# Patient Record
Sex: Male | Born: 1950 | Race: White | Hispanic: No | Marital: Married | Smoking: Never smoker
Health system: Southern US, Community
[De-identification: ages and names within clinical notes are randomized; demographics above are authoritative.]

## PROBLEM LIST (undated history)

## (undated) DIAGNOSIS — R42 Dizziness and giddiness: Secondary | ICD-10-CM

## (undated) DIAGNOSIS — Z860100 Personal history of colon polyps, unspecified: Secondary | ICD-10-CM

## (undated) DIAGNOSIS — I700 Atherosclerosis of aorta: Secondary | ICD-10-CM

## (undated) DIAGNOSIS — I2510 Atherosclerotic heart disease of native coronary artery without angina pectoris: Secondary | ICD-10-CM

## (undated) DIAGNOSIS — E119 Type 2 diabetes mellitus without complications: Secondary | ICD-10-CM

## (undated) DIAGNOSIS — E7800 Pure hypercholesterolemia, unspecified: Secondary | ICD-10-CM

## (undated) DIAGNOSIS — I10 Essential (primary) hypertension: Secondary | ICD-10-CM

## (undated) DIAGNOSIS — K219 Gastro-esophageal reflux disease without esophagitis: Secondary | ICD-10-CM

## (undated) HISTORY — PX: ESOPHAGOGASTRODUODENOSCOPY: SHX1529

## (undated) HISTORY — DX: Essential (primary) hypertension: I10

## (undated) HISTORY — DX: Personal history of colon polyps, unspecified: Z86.0100

## (undated) HISTORY — PX: HAND SURGERY: SHX662

## (undated) HISTORY — DX: Pure hypercholesterolemia, unspecified: E78.00

## (undated) HISTORY — DX: Atherosclerotic heart disease of native coronary artery without angina pectoris: I25.10

## (undated) HISTORY — DX: Dizziness and giddiness: R42

## (undated) HISTORY — DX: Atherosclerosis of aorta: I70.0

## (undated) HISTORY — DX: Gastro-esophageal reflux disease without esophagitis: K21.9

## (undated) HISTORY — DX: Personal history of colonic polyps: Z86.010

## (undated) HISTORY — PX: APPENDECTOMY: SHX54

## (undated) HISTORY — PX: COLONOSCOPY: SHX174

---

## 2011-08-28 DIAGNOSIS — K219 Gastro-esophageal reflux disease without esophagitis: Secondary | ICD-10-CM | POA: Diagnosis present

## 2015-10-23 DIAGNOSIS — M109 Gout, unspecified: Secondary | ICD-10-CM | POA: Diagnosis present

## 2016-04-26 DIAGNOSIS — K760 Fatty (change of) liver, not elsewhere classified: Secondary | ICD-10-CM | POA: Insufficient documentation

## 2016-09-14 DIAGNOSIS — B349 Viral infection, unspecified: Secondary | ICD-10-CM | POA: Insufficient documentation

## 2016-09-14 HISTORY — DX: Viral infection, unspecified: B34.9

## 2017-10-20 DIAGNOSIS — Z91119 Patient's noncompliance with dietary regimen due to unspecified reason: Secondary | ICD-10-CM

## 2017-10-20 HISTORY — DX: Patient's noncompliance with dietary regimen due to unspecified reason: Z91.119

## 2018-05-12 DIAGNOSIS — M792 Neuralgia and neuritis, unspecified: Secondary | ICD-10-CM | POA: Diagnosis present

## 2021-08-06 ENCOUNTER — Ambulatory Visit: Payer: Self-pay | Admitting: Podiatry

## 2021-08-14 ENCOUNTER — Other Ambulatory Visit: Payer: Self-pay

## 2021-08-14 ENCOUNTER — Ambulatory Visit (INDEPENDENT_AMBULATORY_CARE_PROVIDER_SITE_OTHER): Payer: Medicare Other | Admitting: Podiatry

## 2021-08-14 ENCOUNTER — Ambulatory Visit (INDEPENDENT_AMBULATORY_CARE_PROVIDER_SITE_OTHER): Payer: Medicare Other

## 2021-08-14 ENCOUNTER — Encounter: Payer: Self-pay | Admitting: Podiatry

## 2021-08-14 ENCOUNTER — Other Ambulatory Visit: Payer: Self-pay | Admitting: Podiatry

## 2021-08-14 DIAGNOSIS — R208 Other disturbances of skin sensation: Secondary | ICD-10-CM | POA: Diagnosis not present

## 2021-08-14 DIAGNOSIS — M21372 Foot drop, left foot: Secondary | ICD-10-CM | POA: Diagnosis not present

## 2021-08-14 DIAGNOSIS — M7752 Other enthesopathy of left foot: Secondary | ICD-10-CM

## 2021-08-14 NOTE — Progress Notes (Signed)
°  Subjective:  Patient ID: Ivan Dickson, male    DOB: July 14, 1951,  MRN: [id number] HPI Chief Complaint  Patient presents with   Foot Pain    Left Foot - no pain, noticed for last 4-6 weeks when walking foot slaps the ground, no changes in health or injury   New Patient (Initial Visit)   Diabetes    Last a1c was 8.0    70 y.o. male presents with the above complaint.   ROS: Denies fever chills nausea vomiting muscle aches pains calf pain back pain chest pain shortness of breath.  No past medical history on file.   Current Outpatient Medications:    glipiZIDE (GLUCOTROL) 10 MG tablet, Take 10 mg by mouth daily before breakfast., Disp: , Rfl:    pioglitazone (ACTOS) 30 MG tablet, Take 30 mg by mouth daily., Disp: , Rfl:    rosuvastatin (CRESTOR) 20 MG tablet, Take 20 mg by mouth daily., Disp: , Rfl:    lisinopril (ZESTRIL) 2.5 MG tablet, Take 2.5 mg by mouth daily., Disp: , Rfl:    metFORMIN (GLUCOPHAGE-XR) 500 MG 24 hr tablet, Take 1,000 mg by mouth 2 (two) times daily., Disp: , Rfl:    pantoprazole (PROTONIX) 40 MG tablet, Take 40 mg by mouth 2 (two) times daily., Disp: , Rfl:   Allergies  Allergen Reactions   Ciprofloxacin    Review of Systems Objective:  There were no vitals filed for this visit.  General: Well developed, nourished, in no acute distress, alert and oriented x3   Dermatological: Skin is warm, dry and supple bilateral. Nails x 10 are well maintained; remaining integument appears unremarkable at this time. There are no open sores, no preulcerative lesions, no rash or signs of infection present.  Vascular: Dorsalis Pedis artery and Posterior Tibial artery pedal pulses are 2/4 bilateral with immedate capillary fill time. Pedal hair growth present. No varicosities and no lower extremity edema present bilateral.   Neruologic: Grossly intact via light touch bilateral. Vibratory intact via tuning fork bilateral. Protective threshold with Semmes Wienstein monofilament  intact to all pedal sites bilateral. Patellar and Achilles deep tendon reflexes 2+ bilateral. No Babinski or clonus noted bilateral.   Musculoskeletal: No gross boney pedal deformities bilateral. No pain, crepitus, or limitation noted with foot and ankle range of motion bilateral. Muscular strength 5/5 in all groups tested bilateral.  Muscle strength is a 4 out of 5 tibialis anterior left.  Also has a decrease sensation in that same dermatome.  Gait: Unassisted, Nonantalgic.    Radiographs:  Radiographs taken today demonstrate osseously mature individual ankle and foot demonstrate relatively normal architecture.  Some spurring around the medial malleolus that the mortise appears to be normal.  No other significant overlying or acute issues identified.  Assessment & Plan:   Assessment: Diabetes mellitus with no diabetic complications.  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  Plan: Discussed etiology pathology conservative versus surgical therapies.  At this point I think we should send him during this acute period for nerve conduction velocity exam and an EMG.  Once this test return we will direct him to the appropriate referral or treatment plan.     Anju Sereno T. [place], [place]

## 2021-09-17 DIAGNOSIS — I10 Essential (primary) hypertension: Secondary | ICD-10-CM | POA: Diagnosis not present

## 2021-09-17 DIAGNOSIS — E782 Mixed hyperlipidemia: Secondary | ICD-10-CM | POA: Diagnosis not present

## 2021-09-17 DIAGNOSIS — Z8249 Family history of ischemic heart disease and other diseases of the circulatory system: Secondary | ICD-10-CM | POA: Diagnosis not present

## 2021-09-17 DIAGNOSIS — R1310 Dysphagia, unspecified: Secondary | ICD-10-CM | POA: Diagnosis not present

## 2021-09-17 DIAGNOSIS — Z823 Family history of stroke: Secondary | ICD-10-CM | POA: Diagnosis not present

## 2021-09-17 DIAGNOSIS — E1169 Type 2 diabetes mellitus with other specified complication: Secondary | ICD-10-CM | POA: Diagnosis not present

## 2021-09-17 DIAGNOSIS — E1165 Type 2 diabetes mellitus with hyperglycemia: Secondary | ICD-10-CM | POA: Diagnosis not present

## 2021-09-17 DIAGNOSIS — K219 Gastro-esophageal reflux disease without esophagitis: Secondary | ICD-10-CM | POA: Diagnosis not present

## 2021-10-15 ENCOUNTER — Emergency Department (HOSPITAL_COMMUNITY)
Admission: EM | Admit: 2021-10-15 | Discharge: 2021-10-15 | Disposition: A | Discharge status: Home / Self Care | Payer: Medicare HMO | Attending: Emergency Medicine | Admitting: Emergency Medicine

## 2021-10-15 ENCOUNTER — Encounter (HOSPITAL_COMMUNITY): Payer: Self-pay

## 2021-10-15 ENCOUNTER — Emergency Department (HOSPITAL_COMMUNITY): Payer: Medicare HMO

## 2021-10-15 ENCOUNTER — Other Ambulatory Visit: Payer: Self-pay

## 2021-10-15 DIAGNOSIS — M5410 Radiculopathy, site unspecified: Secondary | ICD-10-CM | POA: Diagnosis not present

## 2021-10-15 DIAGNOSIS — M5416 Radiculopathy, lumbar region: Secondary | ICD-10-CM | POA: Diagnosis not present

## 2021-10-15 DIAGNOSIS — M25552 Pain in left hip: Secondary | ICD-10-CM | POA: Diagnosis present

## 2021-10-15 HISTORY — DX: Type 2 diabetes mellitus without complications: E11.9

## 2021-10-15 MED ORDER — HYDROCODONE-ACETAMINOPHEN 5-325 MG PO TABS: 1.0000 | ORAL_TABLET | Freq: Four times a day (QID) | ORAL | 0 refills | Status: DC | PRN

## 2021-10-15 NOTE — Discharge Instructions (Signed)

## 2021-10-15 NOTE — ED Notes (Signed)
Pt stated three weeks ago his left foot was flopping while walking. PCP stated it may be a nerve concern. PCP was suppose to have someone follow-up to do a nerve conductor test. Pt stated it has resolved at the moment.

## 2021-10-15 NOTE — ED Triage Notes (Signed)
Patient complains of left hip pain x 6 days, reports severe at night when he tries to rest. Has used otc meds with no relief. Denies trauma

## 2021-10-15 NOTE — ED Provider Triage Note (Signed)
Emergency Medicine Provider Triage Evaluation Note  Ivan Dickson , a 71 y.o. male  was evaluated in triage.  Pt complains of left hip pain of 6-day duration with shooting pain down left lower extremity.  Denies night sweats, fever, chills.  States he is okay ambulating, or setting however if he stands or lays down the pain is worse.  Of note patient does keep wallet in his back pocket.  Review of Systems  Positive: As above Negative: As above  Physical Exam  BP 130/74    Pulse 93    Temp 98.3 F (36.8 C) (Oral)    Resp 16    SpO2 99%  Gen:   Awake, no distress   Resp:  Normal effort  MSK:   Moves extremities without difficulty  Other:  5/5 strength in left lower extremity.  Full range of motion in left lower extremity.  Medical Decision Making  Medically screening exam initiated at 2:01 PM.  Appropriate orders placed.  Ivan Dickson was informed that the remainder of the evaluation will be completed by another provider, this initial triage assessment does not replace that evaluation, and the importance of remaining in the ED until their evaluation is complete.     Evlyn Courier, PA-C 10/15/21 1402

## 2021-10-15 NOTE — ED Provider Notes (Signed)
[facility] EMERGENCY DEPARTMENT Provider Note   CSN: [id number] Arrival date & time: 10/15/21  1311     History  Chief Complaint  Patient presents with   Hip Pain    Ivan Dickson is a 71 y.o. male.  The history is provided by the patient and the spouse.  Hip Pain This is a new problem. The current episode started more than 2 days ago. The problem occurs daily. The problem has been gradually worsening. Pertinent negatives include no chest pain and no shortness of breath. The symptoms are aggravated by standing. Relieved by: Walking. Treatments tried: Ibuprofen/Aleve. The treatment provided no relief.  Patient presents with left hip and buttock pain.  Is been ongoing for about 6 days.  No trauma he does report recent heavy lifting.  He reports the pain starts in his left buttock and radiates down his posterior thigh.  No weakness or numbness.  He is able to ambulate.  No fecal or urinary incontinence.  No actual back pain.  No previous back or hip surgery.  It is worse with standing and improved with walking    Home Medications Prior to Admission medications   Medication Sig Start Date End Date Taking? Authorizing Provider  HYDROcodone-acetaminophen (NORCO/VICODIN) 5-325 MG tablet Take 1 tablet by mouth every 6 (six) hours as needed for severe pain. 10/15/21  Yes Ripley Fraise, MD  glipiZIDE (GLUCOTROL) 10 MG tablet Take 10 mg by mouth daily before breakfast.    [provider]  lisinopril (ZESTRIL) 2.5 MG tablet Take 2.5 mg by mouth daily. 07/20/21   [provider]  metFORMIN (GLUCOPHAGE-XR) 500 MG 24 hr tablet Take 1,000 mg by mouth 2 (two) times daily. 07/20/21   [provider]  pantoprazole (PROTONIX) 40 MG tablet Take 40 mg by mouth 2 (two) times daily. 07/20/21   [provider]  pioglitazone (ACTOS) 30 MG tablet Take 30 mg by mouth daily.    [provider]  rosuvastatin (CRESTOR) 20 MG tablet Take 20 mg by  mouth daily.    [provider]      Allergies    Ciprofloxacin    Review of Systems   Review of Systems  Constitutional:  Negative for fever.  Respiratory:  Negative for shortness of breath.   Cardiovascular:  Negative for chest pain.  Gastrointestinal:  Negative for blood in stool.  Musculoskeletal:  Positive for arthralgias. Negative for back pain.  Neurological:  Negative for weakness and numbness.  All other systems reviewed and are negative.  Physical Exam Updated Vital Signs BP (!) 162/84 (BP Location: Right Arm)    Pulse 78    Temp 97.7 F (36.5 C) (Oral)    Resp 20    Ht 1.829 m (6')    Wt 95.3 kg    SpO2 99%    BMI 28.48 kg/m  Physical Exam CONSTITUTIONAL: Well developed/well nourished HEAD: Normocephalic/atraumatic EYES: EOMI/PERRL ENMT: Mucous membranes moist NECK: supple no meningeal signs SPINE/BACK:entire spine nontender no bruising/crepitance/stepoffs noted to spine CV: S1/S2 noted, no murmurs/rubs/gallops noted LUNGS: Lungs are clear to auscultation bilaterally, no apparent distress ABDOMEN: soft, nontender, no rebound or guarding GU:no cva tenderness NEURO: Awake/alert, equal motor 5/5 strength noted with the following: hip flexion/knee flexion/extension, foot dorsi/plantar flexion, no clonus bilaterally, no sensory deficit in any dermatome.  Equal patellar/achilles reflex noted (2+) in bilateral lower extremities.  Pt is able to ambulate unassisted. EXTREMITIES: pulses normal, full ROM, no tenderness to palpation of the left buttock.  Distal pulses equal and intact SKIN: warm, color normal PSYCH: no abnormalities of mood noted, alert and oriented to situation  ED Results / Procedures / Treatments   Labs (all labs ordered are listed, but only abnormal results are displayed) Labs Reviewed - No data to display  EKG None  Radiology DG Hip Unilat W or Wo Pelvis 2-3 Views Left  Result Date: 10/15/2021 CLINICAL DATA:  Left hip pain. EXAM: DG HIP  (WITH OR WITHOUT PELVIS) 2-3V LEFT COMPARISON:  None. FINDINGS: There is no evidence of hip fracture or dislocation. There is no evidence of arthropathy or other focal bone abnormality. IMPRESSION: Negative. Electronically Signed   By: Misty Stanley M.D.   On: 10/15/2021 14:31    Procedures Procedures    Medications Ordered in ED Medications - No data to display  ED Course/ Medical Decision Making/ A&P                           Medical Decision Making Risk Prescription drug management.   Patient presents with left hip pain.  Most of his pain is the posterior buttocks and radiates down his leg.  Suspect this may be more of a radiculopathy although he does not have any back pain.  It was preceded by heavy lifting.  No trauma.  He has no focal neurodeficits.  I personally reviewed the x-ray that is negative. Patient has been using ibuprofen without relief.  Advised to use this for the next 5 days, will supplement with Vicodin as needed.  He already has PCP follow-up.  He may eventually need to have MRI.  He is not a candidate for steroids due to his diabetes.   Patient reports his recent history of left foot drop is improved without any focal weakness       Final Clinical Impression(s) / ED Diagnoses Final diagnoses:  Radiculopathy, unspecified spinal region    Rx / DC Orders ED Discharge Orders          Ordered    HYDROcodone-acetaminophen (NORCO/VICODIN) 5-325 MG tablet  Every 6 hours PRN        10/15/21 1723              Ripley Fraise, MD 10/15/21 1728

## 2021-10-15 NOTE — ED Notes (Signed)
Pt stated when he stands or lay stationary he has a sharp shooting pain in his hip. When moving there is no pain. No injuries noted.

## 2021-10-18 DIAGNOSIS — M25552 Pain in left hip: Secondary | ICD-10-CM | POA: Diagnosis not present

## 2021-10-18 DIAGNOSIS — K219 Gastro-esophageal reflux disease without esophagitis: Secondary | ICD-10-CM | POA: Diagnosis not present

## 2021-10-18 DIAGNOSIS — E1165 Type 2 diabetes mellitus with hyperglycemia: Secondary | ICD-10-CM | POA: Diagnosis not present

## 2021-10-18 DIAGNOSIS — R1310 Dysphagia, unspecified: Secondary | ICD-10-CM | POA: Diagnosis not present

## 2021-10-29 DIAGNOSIS — M5442 Lumbago with sciatica, left side: Secondary | ICD-10-CM | POA: Diagnosis not present

## 2021-10-31 ENCOUNTER — Encounter: Payer: Self-pay | Admitting: Gastroenterology

## 2021-11-06 DIAGNOSIS — M545 Low back pain, unspecified: Secondary | ICD-10-CM | POA: Diagnosis not present

## 2021-11-08 DIAGNOSIS — M5106 Intervertebral disc disorders with myelopathy, lumbar region: Secondary | ICD-10-CM | POA: Diagnosis not present

## 2021-11-13 ENCOUNTER — Encounter: Payer: Self-pay | Admitting: Gastroenterology

## 2021-11-13 ENCOUNTER — Telehealth: Payer: Self-pay

## 2021-11-13 ENCOUNTER — Other Ambulatory Visit: Payer: Self-pay

## 2021-11-13 ENCOUNTER — Ambulatory Visit (INDEPENDENT_AMBULATORY_CARE_PROVIDER_SITE_OTHER): Payer: Medicare HMO | Admitting: Gastroenterology

## 2021-11-13 ENCOUNTER — Other Ambulatory Visit (INDEPENDENT_AMBULATORY_CARE_PROVIDER_SITE_OTHER): Payer: Medicare HMO

## 2021-11-13 VITALS — BP 118/68 | HR 86 | Ht 72.0 in | Wt 202.2 lb

## 2021-11-13 DIAGNOSIS — R630 Anorexia: Secondary | ICD-10-CM

## 2021-11-13 DIAGNOSIS — Z1211 Encounter for screening for malignant neoplasm of colon: Secondary | ICD-10-CM

## 2021-11-13 DIAGNOSIS — R1310 Dysphagia, unspecified: Secondary | ICD-10-CM | POA: Diagnosis not present

## 2021-11-13 DIAGNOSIS — K219 Gastro-esophageal reflux disease without esophagitis: Secondary | ICD-10-CM | POA: Diagnosis not present

## 2021-11-13 NOTE — Progress Notes (Signed)
? ? ?          ?Chief Complaint: Dysphagia, GERD ? ? ?Referring Provider:     Harlen Labs, NP ? ? ?HPI:   ? ? ?Ivan Dickson is a 71 y.o. male with a history of HLD, HTN, diabetes, referred to the Gastroenterology Clinic for evaluation of dysphagia and GERD. ? ?He has a longstanding history for 15+ years of reflux with index symptoms of chronic cough, globus sensation, heartburn, regurgitation, hoarseness. Worse with supine, spicy foods .  HB, regurgitation largely controlled with pantoprazole 40 mg/qhs, but globus sensation, throat clearing, dysphagia persist. Dysphagia has been present for years, pointing to anterior neck/suprasternal notch. No food impactions. Intermittent choking with liquids as well. Occasional Tums prn breakthrough heartburn, but that is rare. Nexium was not effective.  Recently increased until resulted 40 mg bid. ? ?Additionally, he reports a loss of appetite over the last couple of months.  Weight stable. No f/c/n/v. No early satiety per se.  No nausea/vomiting. ? ?Had an EGD years ago in [place], and has since moved to [facility].  Prior EGD report not available for review. He does not recall results.  ? ?Had a negative Cologuard approximately 4 years ago. Has had 2 previous colonoscopies with polyps on the 1st (pathology and other details unknown), then none on the 2nd. Since then has had several negative Cologuard tests.  ? ?No known family history of CRC, GI malignancy, liver disease, pancreatic disease, or IBD.  Sister with RCC.  ? ? ?Past Medical History:  ?Diagnosis Date  ? Diabetes mellitus without complication (HCC)   ? GERD (gastroesophageal reflux disease)   ? High cholesterol   ? History of colon polyps   ? ? ? ?Past Surgical History:  ?Procedure Laterality Date  ? APPENDECTOMY    ? COLONOSCOPY    ? found polyps over 10 years ago  ? ESOPHAGOGASTRODUODENOSCOPY    ? over 10 years ago thinks he did  ? HAND SURGERY Right   ? ?Family History  ?Problem Relation Age of Onset  ? Kidney  cancer Sister   ?     renal cell cancer  ? Breast cancer Sister   ? Colon cancer Neg Hx   ? Esophageal cancer Neg Hx   ? ?Social History  ? ?Tobacco Use  ? Smoking status: Never  ? Smokeless tobacco: Never  ?Vaping Use  ? Vaping Use: Never used  ?Substance Use Topics  ? Alcohol use: Yes  ?  Comment: weekly  ? Drug use: Not Currently  ? ?Current Outpatient Medications  ?Medication Sig Dispense Refill  ? glipiZIDE (GLUCOTROL) 10 MG tablet Take 10 mg by mouth 2 (two) times daily before a meal.    ? lisinopril (ZESTRIL) 10 MG tablet Take 10 mg by mouth daily.    ? metFORMIN (GLUCOPHAGE-XR) 500 MG 24 hr tablet Take 1,000 mg by mouth 2 (two) times daily.    ? OZEMPIC, 0.25 OR 0.5 MG/DOSE, 2 MG/1.5ML SOPN Inject 0.5 mg into the skin once a week.    ? pantoprazole (PROTONIX) 40 MG tablet Take 40 mg by mouth 2 (two) times daily.    ? pioglitazone (ACTOS) 30 MG tablet Take 30 mg by mouth daily.    ? rosuvastatin (CRESTOR) 20 MG tablet Take 20 mg by mouth daily.    ? ?No current facility-administered medications for this visit.  ? ?Allergies  ?Allergen Reactions  ? Ciprofloxacin   ? ? ? ?Review of Systems: ?All systems reviewed and negative  except where noted in HPI.  ? ? ? ?Physical Exam:   ? ?Wt Readings from Last 3 Encounters:  ?11/13/21 202 lb 4 oz (91.7 kg)  ?10/15/21 210 lb (95.3 kg)  ? ? ?BP 118/68   Pulse 86   Ht 6' (1.829 m)   Wt 202 lb 4 oz (91.7 kg)   BMI 27.43 kg/m?  ?Constitutional:  Pleasant, in no acute distress. ?Psychiatric: Normal mood and affect. Behavior is normal. ?EENT: Pupils normal.  Conjunctivae are normal. No scleral icterus. ?Neck supple. No cervical LAD. ?Cardiovascular: Normal rate, regular rhythm. No edema ?Pulmonary/chest: Effort normal and breath sounds normal. No wheezing, rales or rhonchi. ?Abdominal: Soft, nondistended, nontender. Bowel sounds active throughout. There are no masses palpable. No hepatomegaly. ?Neurological: Alert and oriented to person place and time. ?Skin: Skin is warm  and dry. No rashes noted. ? ? ?ASSESSMENT AND PLAN;  ? ?1) GERD ?2) Dysphagia ?- EGD to evaluate for erosive esophagitis, LES laxity along with esophageal stricture, ring, etc. with biopsies and dilation as appropriate ?- Continue current therapy (recently increased pantoprazole to 40 mg bid; evaluating efficacy of higher dose now) ?- Continue antireflux lifestyle/dietary modifications ? ?3) Decreased appetite ?- Evaluate for mucosal/luminal pathology time EGD as above along with gastric biopsies ?- Check CBC, CMP, TSH ?- If no clear etiology and ongoing symptoms, plan for extended work-up for non-GI causes of decreased appetite ? ?4) Colon cancer screening/Reported history of colon polyps ?- Due for ongoing CRC screening ?- Schedule colonoscopy ? ?The indications, risks, and benefits of EGD and colonoscopy were explained to the patient in detail. Risks include but are not limited to bleeding, perforation, adverse reaction to medications, and cardiopulmonary compromise. Sequelae include but are not limited to the possibility of surgery, hospitalization, and mortality. The patient verbalized understanding and wished to proceed. All questions answered, referred to scheduler and bowel prep ordered. Further recommendations pending results of the exam.  ? ? ? ? ?Shellia Cleverly, DO, FACG  11/13/2021, 1:21 PM ? ? ?Harlen Labs, NP ? ?

## 2021-11-13 NOTE — Telephone Encounter (Signed)
Patient just left the clinic. Called the patient to inform not to take his Ozempic in the morning on the day of his procedure which is on Thursday, 11/22/21. Patient verbalized understanding that he takes ozempic every Thursday in the morning but he won't take it on that day of his procedure. ?

## 2021-11-13 NOTE — Patient Instructions (Addendum)
If you are age 70 or older, your body mass index should be between 23-30. Your Body mass index is 27.43 kg/m?Marland Kitchen If this is out of the aforementioned range listed, please consider follow up with your Primary Care Provider. ? ?__________________________________________________________ ? ?The [facility] providers would like to encourage you to use [facility] to communicate with providers for non-urgent requests or questions.  Due to long hold times on the telephone, sending your provider a message by [facility] may be a faster and more efficient way to get a response.  Please allow 48 business hours for a response.  Please remember that this is for non-urgent requests.  ? ?Due to recent changes in healthcare laws, you may see the results of your imaging and laboratory studies on MyChart before your provider has had a chance to review them.  We understand that in some cases there may be results that are confusing or concerning to you. Not all laboratory results come back in the same time frame and the provider may be waiting for multiple results in order to interpret others.  Please give [place] 48 hours in order for your provider to thoroughly review all the results before contacting the office for clarification of your results.  ? ?Please go to the lab on the 2nd floor [address] before you leave the office today.   ? ?We have given you samples of the following medication to take: Clenpiq ? ?You have been scheduled for an endoscopy and colonoscopy. Please follow the written instructions given to you at your visit today. ?Please pick up your prep supplies at the pharmacy within the next 1-3 days. ?If you use inhalers (even only as needed), please bring them with you on the day of your procedure.  ? ?Thank you for choosing me and [facility]. ? ?Doristine Locks, D.O. ? ? ?

## 2021-11-14 LAB — CBC
HCT: 40.7 % (ref 39.0–52.0)
Hemoglobin: 13.8 g/dL (ref 13.0–17.0)
MCHC: 33.9 g/dL (ref 30.0–36.0)
MCV: 88 fl (ref 78.0–100.0)
Platelets: 214 10*3/uL (ref 150.0–400.0)
RBC: 4.62 Mil/uL (ref 4.22–5.81)
RDW: 13.4 % (ref 11.5–15.5)
WBC: 6.3 10*3/uL (ref 4.0–10.5)

## 2021-11-14 LAB — COMPREHENSIVE METABOLIC PANEL
ALT: 19 U/L (ref 0–53)
AST: 14 U/L (ref 0–37)
Albumin: 4.5 g/dL (ref 3.5–5.2)
Alkaline Phosphatase: 66 U/L (ref 39–117)
BUN: 33 mg/dL — ABNORMAL HIGH (ref 6–23)
CO2: 26 mEq/L (ref 19–32)
Calcium: 9 mg/dL (ref 8.4–10.5)
Chloride: 102 mEq/L (ref 96–112)
Creatinine, Ser: 1.43 mg/dL (ref 0.40–1.50)
GFR: 49.92 mL/min — ABNORMAL LOW (ref >60.00)
Glucose, Bld: 190 mg/dL — ABNORMAL HIGH (ref 70–99)
Potassium: 4.9 mEq/L (ref 3.5–5.1)
Sodium: 137 mEq/L (ref 135–145)
Total Bilirubin: 0.5 mg/dL (ref 0.2–1.2)
Total Protein: 6.9 g/dL (ref 6.0–8.3)

## 2021-11-14 LAB — TSH: TSH: 3.04 u[IU]/mL (ref 0.35–5.50)

## 2021-11-15 DIAGNOSIS — M5106 Intervertebral disc disorders with myelopathy, lumbar region: Secondary | ICD-10-CM | POA: Diagnosis not present

## 2021-11-15 DIAGNOSIS — M5416 Radiculopathy, lumbar region: Secondary | ICD-10-CM | POA: Diagnosis not present

## 2021-11-22 ENCOUNTER — Ambulatory Visit (AMBULATORY_SURGERY_CENTER): Payer: Medicare HMO | Admitting: Gastroenterology

## 2021-11-22 ENCOUNTER — Encounter: Payer: Self-pay | Admitting: Gastroenterology

## 2021-11-22 VITALS — BP 108/75 | HR 68 | Temp 96.8°F | Resp 12 | Ht 72.0 in | Wt 202.0 lb

## 2021-11-22 DIAGNOSIS — R630 Anorexia: Secondary | ICD-10-CM | POA: Diagnosis not present

## 2021-11-22 DIAGNOSIS — R1310 Dysphagia, unspecified: Secondary | ICD-10-CM | POA: Diagnosis not present

## 2021-11-22 DIAGNOSIS — Z1211 Encounter for screening for malignant neoplasm of colon: Secondary | ICD-10-CM

## 2021-11-22 DIAGNOSIS — K219 Gastro-esophageal reflux disease without esophagitis: Secondary | ICD-10-CM

## 2021-11-22 DIAGNOSIS — K640 First degree hemorrhoids: Secondary | ICD-10-CM

## 2021-11-22 MED ORDER — SODIUM CHLORIDE 0.9 % IV SOLN: 500.0000 mL | Freq: Once | INTRAVENOUS | Status: DC

## 2021-11-22 NOTE — Patient Instructions (Signed)
YOU HAD AN ENDOSCOPIC PROCEDURE TODAY AT THE [facility]:   Refer to the procedure report that was given to you for any specific questions about what was found during the examination.  If the procedure report does not answer your questions, please call your gastroenterologist to clarify.  If you requested that your care partner not be given the details of your procedure findings, then the procedure report has been included in a sealed envelope for you to review at your convenience later. ° °**Handout given on hemorrhoids** ° °YOU SHOULD EXPECT: Some feelings of bloating in the abdomen. Passage of more gas than usual.  Walking can help get rid of the air that was put into your GI tract during the procedure and reduce the bloating. If you had a lower endoscopy (such as a colonoscopy or flexible sigmoidoscopy) you may notice spotting of blood in your stool or on the toilet paper. If you underwent a bowel prep for your procedure, you may not have a normal bowel movement for a few days. ° °Please Note:  You might notice some irritation and congestion in your nose or some drainage.  This is from the oxygen used during your procedure.  There is no need for concern and it should clear up in a day or so. ° °SYMPTOMS TO REPORT IMMEDIATELY: ° °Following lower endoscopy (colonoscopy or flexible sigmoidoscopy): ° Excessive amounts of blood in the stool ° Significant tenderness or worsening of abdominal pains ° Swelling of the abdomen that is new, acute ° Fever of 100°F or higher ° °Following upper endoscopy (EGD) ° Vomiting of blood or coffee ground material ° New chest pain or pain under the shoulder blades ° Painful or persistently difficult swallowing ° New shortness of breath ° Fever of 100°F or higher ° Black, tarry-looking stools ° °For urgent or emergent issues, a gastroenterologist can be reached at any hour by calling [phone]. °Do not use MyChart messaging for urgent concerns.  ° ° °DIET:  We do  recommend a small meal at first, but then you may proceed to your regular diet.  Drink plenty of fluids but you should avoid alcoholic beverages for 24 hours. ° °ACTIVITY:  You should plan to take it easy for the rest of today and you should NOT DRIVE or use heavy machinery until tomorrow (because of the sedation medicines used during the test).   ° °FOLLOW UP: °Our staff will call the number listed on your records 48-72 hours following your procedure to check on you and address any questions or concerns that you may have regarding the information given to you following your procedure. If we do not reach you, we will leave a message.  We will attempt to reach you two times.  During this call, we will ask if you have developed any symptoms of COVID 19. If you develop any symptoms (ie: fever, flu-like symptoms, shortness of breath, cough etc.) before then, please call [phone].  If you test positive for Covid 19 in the 2 weeks post procedure, please call and report this information to us.   ° °If any biopsies were taken you will be contacted by phone or by letter within the next 1-3 weeks.  Please call us at [phone] if you have not heard about the biopsies in 3 weeks.  ° ° °SIGNATURES/CONFIDENTIALITY: °You and/or your care partner have signed paperwork which will be entered into your electronic medical record.  These signatures attest to the fact that that the   information above on your After Visit Summary has been reviewed and is understood.  Full responsibility of the confidentiality of this discharge information lies with you and/or your care-partner.  °

## 2021-11-22 NOTE — Progress Notes (Signed)
? ?GASTROENTEROLOGY PROCEDURE H&P NOTE  ? ?Primary Care Physician: ?Pcp, No ? ? ? ?Reason for Procedure:   GERD, dysphagia, CRC screening  ? ?Plan:    EGD, colonoscopy ? ?Patient is appropriate for endoscopic procedure(s) in the ambulatory ([place]) setting. ? ?The nature of the procedure, as well as the risks, benefits, and alternatives were carefully and thoroughly reviewed with the patient. Ample time for discussion and questions allowed. The patient understood, was satisfied, and agreed to proceed.  ? ? ? ?HPI: ?Ivan Dickson is a 71 y.o. male who presents for EGD for evaluation of GERD and dysphagia and for colonsocopy for CRC screening.  ? ?Past Medical History:  ?Diagnosis Date  ? Diabetes mellitus without complication ([place])   ? GERD (gastroesophageal reflux disease)   ? High cholesterol   ? History of colon polyps   ? ? ?Past Surgical History:  ?Procedure Laterality Date  ? APPENDECTOMY    ? COLONOSCOPY    ? found polyps over 10 years ago  ? ESOPHAGOGASTRODUODENOSCOPY    ? over 10 years ago thinks he did  ? HAND SURGERY Right   ? ? ?Prior to Admission medications   ?Medication Sig Start Date End Date Taking? Authorizing Provider  ?Blood Glucose Monitoring Suppl (TRUE METRIX METER) w/Device KIT See admin instructions. 11/20/21  Yes [provider]  ?glipiZIDE (GLUCOTROL) 10 MG tablet Take 10 mg by mouth 2 (two) times daily before a meal.   Yes [provider]  ?metFORMIN (GLUCOPHAGE-XR) 500 MG 24 hr tablet Take 1,000 mg by mouth 2 (two) times daily. 07/20/21  Yes [provider]  ?pantoprazole (PROTONIX) 40 MG tablet Take 40 mg by mouth 2 (two) times daily. 07/20/21  Yes [provider]  ?pioglitazone (ACTOS) 30 MG tablet Take 30 mg by mouth daily.   Yes [provider]  ?rosuvastatin (CRESTOR) 20 MG tablet Take 20 mg by mouth daily.   Yes [provider]  ?TRUE METRIX BLOOD GLUCOSE TEST test strip daily. 11/20/21  Yes [provider]  ?lisinopril  (ZESTRIL) 10 MG tablet Take 10 mg by mouth daily. ?Patient not taking: Reported on 11/22/2021 09/17/21   [provider]  ?OZEMPIC, 0.25 OR 0.5 MG/DOSE, 2 MG/1.5ML SOPN Inject 0.5 mg into the skin once a week. 10/18/21   [provider]  ? ? ?Current Outpatient Medications  ?Medication Sig Dispense Refill  ? Blood Glucose Monitoring Suppl (TRUE METRIX METER) w/Device KIT See admin instructions.    ? glipiZIDE (GLUCOTROL) 10 MG tablet Take 10 mg by mouth 2 (two) times daily before a meal.    ? metFORMIN (GLUCOPHAGE-XR) 500 MG 24 hr tablet Take 1,000 mg by mouth 2 (two) times daily.    ? pantoprazole (PROTONIX) 40 MG tablet Take 40 mg by mouth 2 (two) times daily.    ? pioglitazone (ACTOS) 30 MG tablet Take 30 mg by mouth daily.    ? rosuvastatin (CRESTOR) 20 MG tablet Take 20 mg by mouth daily.    ? TRUE METRIX BLOOD GLUCOSE TEST test strip daily.    ? lisinopril (ZESTRIL) 10 MG tablet Take 10 mg by mouth daily. (Patient not taking: Reported on 11/22/2021)    ? OZEMPIC, 0.25 OR 0.5 MG/DOSE, 2 MG/1.5ML SOPN Inject 0.5 mg into the skin once a week.    ? ?Current Facility-Administered Medications  ?Medication Dose Route Frequency Provider Last Rate Last Admin  ? 0.9 %  sodium chloride infusion  500 mL Intravenous Once Bailyn Spackman V, DO      ? ? ?  Allergies as of 11/22/2021 - Review Complete 11/22/2021  ?Allergen Reaction Noted  ? Ciprofloxacin  08/14/2021  ? ? ?Family History  ?Problem Relation Age of Onset  ? Kidney cancer Sister   ?     renal cell cancer  ? Breast cancer Sister   ? Colon cancer Neg Hx   ? Esophageal cancer Neg Hx   ? ? ?Social History  ? ?Socioeconomic History  ? Marital status: Married  ?  Spouse name: Not on file  ? Number of children: Not on file  ? Years of education: Not on file  ? Highest education level: Not on file  ?Occupational History  ? Not on file  ?Tobacco Use  ? Smoking status: Never  ? Smokeless tobacco: Never  ?Vaping Use  ? Vaping Use: Never used  ?Substance and  Sexual Activity  ? Alcohol use: Yes  ?  Comment: weekly  ? Drug use: Not Currently  ? Sexual activity: Not on file  ?Other Topics Concern  ? Not on file  ?Social History Narrative  ? Not on file  ? ?Social Determinants of Health  ? ?Financial Resource Strain: Not on file  ?Food Insecurity: Not on file  ?Transportation Needs: Not on file  ?Physical Activity: Not on file  ?Stress: Not on file  ?Social Connections: Not on file  ?Intimate Partner Violence: Not on file  ? ? ?Physical Exam: ?Vital signs in last 24 hours: ?$RemoveBe'@BP'DvLvTiZJS$  122/65   Pulse 86   Temp (!) 96.8 ?F (36 ?C)   Ht 6' (1.829 m)   Wt 202 lb (91.6 kg)   SpO2 99%   BMI 27.40 kg/m?  ?GEN: NAD ?EYE: Sclerae anicteric ?ENT: MMM ?CV: Non-tachycardic ?Pulm: CTA b/l ?GI: Soft, NT/ND ?NEURO:  Alert & Oriented x 3 ? ? ?Gerrit Heck, DO ?[facility] ? ? ?11/22/2021 2:02 PM ? ?

## 2021-11-22 NOTE — Op Note (Signed)
[place] ?Patient Name: Ivan Dickson ?Procedure Date: 11/22/2021 1:52 PM ?MRN: [id number] ?Endoscopist: Gerrit Heck , MD ?Age: 71 ?Referring MD:  ?Date of Birth: 1951/05/28 ?Gender: Male ?Account #: [id number] ?Procedure:                Upper GI endoscopy ?Indications:              Dysphagia, Suspected esophageal reflux, Decreased  ?                          appetite ?Medicines:                Monitored Anesthesia Care ?Procedure:                Pre-Anesthesia Assessment: ?                          - Prior to the procedure, a History and Physical  ?                          was performed, and patient medications and  ?                          allergies were reviewed. The patient's tolerance of  ?                          previous anesthesia was also reviewed. The risks  ?                          and benefits of the procedure and the sedation  ?                          options and risks were discussed with the patient.  ?                          All questions were answered, and informed consent  ?                          was obtained. Prior Anticoagulants: The patient has  ?                          taken no previous anticoagulant or antiplatelet  ?                          agents. ASA Grade Assessment: II - A patient with  ?                          mild systemic disease. After reviewing the risks  ?                          and benefits, the patient was deemed in  ?                          satisfactory condition to undergo the procedure. ?  After obtaining informed consent, the endoscope was  ?                          passed under direct vision. Throughout the  ?                          procedure, the patient's blood pressure, pulse, and  ?                          oxygen saturations were monitored continuously. The  ?                          Endoscope was introduced through the mouth, and  ?                          advanced to the second part of duodenum. The  upper  ?                          GI endoscopy was accomplished without difficulty.  ?                          The patient tolerated the procedure well. ?Scope In: ?Scope Out: ?Findings:                 The examined esophagus was normal. A guidewire was  ?                          placed and the scope was withdrawn. Dilation was  ?                          performed with a Savary dilator with no resistance  ?                          at 17 mm. The dilation site was examined following  ?                          endoscope reinsertion and showed no bleeding,  ?                          mucosal tear or perforation. Estimated blood loss:  ?                          none. Biopsies were then obtained from the proximal  ?                          and distal esophagus with cold forceps for  ?                          histology of suspected eosinophilic esophagitis.  ?                          Estimated blood loss was minimal. ?  The Z-line was regular and was found 40 cm from the  ?                          incisors. ?                          The gastroesophageal flap valve was visualized  ?                          endoscopically and classified as Hill Grade II  ?                          (fold present, opens with respiration). ?                          The entire examined stomach was normal. ?                          The examined duodenum was normal. ?Complications:            No immediate complications. ?Estimated Blood Loss:     Estimated blood loss was minimal. ?Impression:               - Normal esophagus. Dilated with 17 mm Savary  ?                          dilator, then biopsied. ?                          - Z-line regular, 40 cm from the incisors. ?                          - Gastroesophageal flap valve classified as Hill  ?                          Grade II (fold present, opens with respiration). ?                          - Normal stomach. ?                          - Normal examined  duodenum. ?Recommendation:           - Patient has a contact number available for  ?                          emergencies. The signs and symptoms of potential  ?                          delayed complications were discussed with the  ?                          patient. Return to normal activities tomorrow.  ?                          Written discharge instructions were provided to the  ?  patient. ?                          - Resume previous diet. ?                          - Continue present medications. ?                          - Await pathology results. ?                          - Repeat upper endoscopy PRN. ?Gerrit Heck, MD ?11/22/2021 2:42:49 PM ?

## 2021-11-22 NOTE — Progress Notes (Signed)
Called to room to assist during endoscopic procedure.  Patient ID and intended procedure confirmed with present staff. Received instructions for my participation in the procedure from the performing physician.  

## 2021-11-22 NOTE — Op Note (Signed)
[place] ?Patient Name: Ivan Dickson ?Procedure Date: 11/22/2021 1:52 PM ?MRN: [id number] ?Endoscopist: Gerrit Heck , MD ?Age: 71 ?Referring MD:  ?Date of Birth: 1950-09-30 ?Gender: Male ?Account #: [id number] ?Procedure:                Colonoscopy ?Indications:              Screening for colorectal malignant neoplasm (last  ?                          colonoscopy was 10+ years ago) ?Medicines:                Monitored Anesthesia Care ?Procedure:                Pre-Anesthesia Assessment: ?                          - Prior to the procedure, a History and Physical  ?                          was performed, and patient medications and  ?                          allergies were reviewed. The patient's tolerance of  ?                          previous anesthesia was also reviewed. The risks  ?                          and benefits of the procedure and the sedation  ?                          options and risks were discussed with the patient.  ?                          All questions were answered, and informed consent  ?                          was obtained. Prior Anticoagulants: The patient has  ?                          taken no previous anticoagulant or antiplatelet  ?                          agents. ASA Grade Assessment: II - A patient with  ?                          mild systemic disease. After reviewing the risks  ?                          and benefits, the patient was deemed in  ?                          satisfactory condition to undergo the procedure. ?  After obtaining informed consent, the colonoscope  ?                          was passed under direct vision. Throughout the  ?                          procedure, the patient's blood pressure, pulse, and  ?                          oxygen saturations were monitored continuously. The  ?                          Olympus CF-HQ190L (Serial# [id number]) Colonoscope was  ?                          introduced through the anus and  advanced to the the  ?                          terminal ileum. The colonoscopy was performed  ?                          without difficulty. The patient tolerated the  ?                          procedure well. The quality of the bowel  ?                          preparation was good. The terminal ileum, ileocecal  ?                          valve, appendiceal orifice, and rectum were  ?                          photographed. ?Scope In: 2:21:45 PM ?Scope Out: 2:35:42 PM ?Scope Withdrawal Time: 0 hours 11 minutes 31 seconds  ?Total Procedure Duration: 0 hours 13 minutes 57 seconds  ?Findings:                 The perianal and digital rectal examinations were  ?                          normal. ?                          The colon appeared normal. ?                          Non-bleeding internal hemorrhoids were found during  ?                          retroflexion. The hemorrhoids were small and Grade  ?                          I (internal hemorrhoids that do not prolapse). ?  The terminal ileum appeared normal. ?Complications:            No immediate complications. ?Estimated Blood Loss:     Estimated blood loss: none. ?Impression:               - The entire examined colon is normal. ?                          - Non-bleeding internal hemorrhoids. ?                          - The examined portion of the ileum was normal. ?                          - No specimens collected. ?Recommendation:           - Patient has a contact number available for  ?                          emergencies. The signs and symptoms of potential  ?                          delayed complications were discussed with the  ?                          patient. Return to normal activities tomorrow.  ?                          Written discharge instructions were provided to the  ?                          patient. ?                          - Resume previous diet. ?                          - Continue present medications. ?                           - Repeat colonoscopy is not recommended due to  ?                          current age (7 years or older) for screening  ?                          purposes. ?                          - Return to GI office PRN. ?Gerrit Heck, MD ?11/22/2021 2:46:40 PM ?

## 2021-11-22 NOTE — Progress Notes (Signed)
VS  DT ? ?Pt's states no medical or surgical changes since previsit or office visit. ? ?

## 2021-11-22 NOTE — Progress Notes (Signed)
Pt non-responsive, VVS, Report to RN  °

## 2021-11-26 ENCOUNTER — Telehealth: Payer: Self-pay | Admitting: *Deleted

## 2021-11-26 NOTE — Telephone Encounter (Signed)
?  Follow up Call- ? ? ?  11/22/2021  ?  1:35 PM  ?Call back number  ?Post procedure Call Back phone  # [phone]  ?Permission to leave phone message Yes  ?  ? ?Patient questions: ? ?Do you have a fever, pain , or abdominal swelling? No. ?Pain Score  0 * ? ?Have you tolerated food without any problems? Yes.   ? ?Have you been able to return to your normal activities? Yes.   ? ?Do you have any questions about your discharge instructions: ?Diet   No. ?Medications  No. ?Follow up visit  No. ? ?Do you have questions or concerns about your Care? No. ? ?Actions: ?* If pain score is 4 or above: ?No action needed, pain <4. ? ? ?

## 2021-11-27 ENCOUNTER — Encounter: Payer: Self-pay | Admitting: Gastroenterology

## 2021-12-05 DIAGNOSIS — M5106 Intervertebral disc disorders with myelopathy, lumbar region: Secondary | ICD-10-CM | POA: Diagnosis not present

## 2021-12-21 NOTE — Progress Notes (Signed)
?  ?Cardiology Office Note ? ? ?Date:  12/26/2021  ? ?ID:  Ivan Dickson, DOB Aug 30, 1951, MRN [id number] ? ?PCP:  Pcp, No  ?Cardiologist:   Waylin Dorko Martinique, MD  ? ?Chief Complaint  ?Patient presents with  ? Dizziness  ? ? ?  ?History of Present Illness: ?Ivan Dickson is a 71 y.o. male who is seen at the request of Jaclynn Major for evaluation of HTN and family history of CAD. Has history of HLD and DM type 2.  ? ?He reports over past year that he has symptoms of lightheadedness and dizziness when out working in the yard. Worse after squatting or bending over. Noted BP as low as 60/40. Was on lisinopril for renal protection but this has been discontinued. No real change in symptoms. When this happens he stops and rests. No true syncope.  Does complain of exertional pain in neck and shoulders. Did see a cardiologist a couple of years ago in [place]. Apparently had stress test, Echo, and Zio patch monitor then. Is very concerned about CV risk given risk factors of DM, HLD, and strong family history of CAD.  ? ? ? ?Past Medical History:  ?Diagnosis Date  ? Diabetes mellitus without complication ([place])   ? GERD (gastroesophageal reflux disease)   ? High cholesterol   ? History of colon polyps   ? ? ?Past Surgical History:  ?Procedure Laterality Date  ? APPENDECTOMY    ? COLONOSCOPY    ? found polyps over 10 years ago  ? ESOPHAGOGASTRODUODENOSCOPY    ? over 10 years ago thinks he did  ? HAND SURGERY Right   ? ? ? ?Current Outpatient Medications  ?Medication Sig Dispense Refill  ? Blood Glucose Monitoring Suppl (TRUE METRIX METER) w/Device KIT See admin instructions.    ? glipiZIDE (GLUCOTROL) 10 MG tablet Take 10 mg by mouth 2 (two) times daily before a meal.    ? metFORMIN (GLUCOPHAGE-XR) 500 MG 24 hr tablet Take 1,000 mg by mouth 2 (two) times daily.    ? metoprolol tartrate (LOPRESSOR) 50 MG tablet Take 50 mg 2 hours before Coronary CT 1 tablet 0  ? OZEMPIC, 0.25 OR 0.5 MG/DOSE, 2 MG/1.5ML SOPN Inject 0.5 mg into the  skin once a week.    ? pantoprazole (PROTONIX) 40 MG tablet Take 40 mg by mouth 2 (two) times daily.    ? pioglitazone (ACTOS) 30 MG tablet Take 30 mg by mouth daily.    ? rosuvastatin (CRESTOR) 20 MG tablet Take 20 mg by mouth daily.    ? TRUE METRIX BLOOD GLUCOSE TEST test strip daily.    ? ?No current facility-administered medications for this visit.  ? ? ?Allergies:   Ciprofloxacin  ? ? ?Social History:  The patient  reports that he has never smoked. He has never used smokeless tobacco. He reports current alcohol use. He reports that he does not currently use drugs.  ? ?Family History:  The patient's family history includes Arrhythmia in his sister; Breast cancer in his sister; Diabetes in his mother; Heart attack in his brother; Heart disease in his brother and mother; Heart failure in his son; Hyperlipidemia in his mother; Hypertension in his mother; Kidney cancer in his sister; Stroke in his father.  ? ? ?ROS:  Please see the history of present illness.   Otherwise, review of systems are positive for none.   All other systems are reviewed and negative.  ? ? ?PHYSICAL EXAM: ?VS:  BP (!) 161/76   Pulse  61   Ht 6' (1.829 m)   Wt 204 lb (92.5 kg)   SpO2 98%   BMI 27.67 kg/m?  , BMI Body mass index is 27.67 kg/m?. ?On my exam  ?Supine BP 142/70 ?Sitting BP 140/70 ?Standing 1 minute 138/70 ?Standing 3 minutes 128/64 ? ?Gen: Well nourished, well developed, in no acute distress ?HEENT: normal ?Neck: no JVD, carotid bruits, or masses ?Cardiac: RRR; no murmurs, rubs, or gallops,no edema  ?Respiratory:  clear to auscultation bilaterally, normal work of breathing ?GI: soft, nontender, nondistended, + BS ?MS: no deformity or atrophy ?Skin: warm and dry, no rash ?Neuro:  Strength and sensation are intact ?Psych: euthymic mood, full affect ? ? ?EKG:  EKG is ordered today. ?The ekg ordered today demonstrates NSR rate 61. Normal. I have personally reviewed and interpreted this study. ? ? ? ?Recent Labs: ?11/13/2021: ALT  19; BUN 33; Creatinine, Ser 1.43; Hemoglobin 13.8; Platelets 214.0; Potassium 4.9; Sodium 137; TSH 3.04  ? ? ?Lipid Panel ?No results found for: CHOL, TRIG, HDL, CHOLHDL, VLDL, LDLCALC, LDLDIRECT ?  ? ?Wt Readings from Last 3 Encounters:  ?12/26/21 204 lb (92.5 kg)  ?11/22/21 202 lb (91.6 kg)  ?11/13/21 202 lb 4 oz (91.7 kg)  ?  ? ? ?Other studies Reviewed: ?Additional studies/ records that were reviewed today include: none. ?Review of the above records demonstrates: N/A ? ? ?ASSESSMENT AND PLAN: ? ?1.  Lightheadedness and dizziness. Somewhat positional. Has been associated with low BP. Not that orthostatic today. Suspect he has some degree of autonomic dysfunction related to his DM. Agree with stopping lisinopril. Liberalize sodium in diet. Focus on maintaining good hydration, support hose. Sleep with some incline. ?2. Exertional neck and should pain. Will proceed with coronary CTA to establish CV risk given multiple risk factors ?3. HLD. Will update lipid panel.  ?4. Renal insufficieny. Creatinine 1.41. will repeat now that off ACEi ? ?Will also request prior records from [facility] including results of stress test, Echo, monitor and prior labs.  ? ? ?Current medicines are reviewed at length with the patient today.  The patient does not have concerns regarding medicines. ? ?The following changes have been made:  no change ? ?Labs/ tests ordered today include:  ? ?Orders Placed This Encounter  ?Procedures  ? CT CORONARY MORPH W/CTA COR W/SCORE W/CA W/CM &/OR WO/CM  ? Basic metabolic panel  ? Lipid panel  ? EKG 12-Lead  ? ? ?   ? ? ?Disposition:   FU with me in after above studies. ? ?Signed, ?Zachary Lovins Martinique, MD  ?12/26/2021 10:41 AM    ?[facility] ?[address] [place], [place], [address] ?Phone [phone], Fax [phone] ? ? ?

## 2021-12-21 NOTE — H&P (View-Only) (Signed)
?  ?Cardiology Office Note ? ? ?Date:  12/26/2021  ? ?ID:  Ivan Dickson, DOB 05/03/1951, MRN [id number] ? ?PCP:  Pcp, No  ?Cardiologist:   Nicki Gracy, MD  ? ?Chief Complaint  ?Patient presents with  ? Dizziness  ? ? ?  ?History of Present Illness: ?Ivan Dickson is a 70 y.o. male who is seen at the request of Jacqueline Mawoneke for evaluation of HTN and family history of CAD. Has history of HLD and DM type 2.  ? ?He reports over past year that he has symptoms of lightheadedness and dizziness when out working in the yard. Worse after squatting or bending over. Noted BP as low as 60/40. Was on lisinopril for renal protection but this has been discontinued. No real change in symptoms. When this happens he stops and rests. No true syncope.  Does complain of exertional pain in neck and shoulders. Did see a cardiologist a couple of years ago in [place]. Apparently had stress test, Echo, and Zio patch monitor then. Is very concerned about CV risk given risk factors of DM, HLD, and strong family history of CAD.  ? ? ? ?Past Medical History:  ?Diagnosis Date  ? Diabetes mellitus without complication (HCC)   ? GERD (gastroesophageal reflux disease)   ? High cholesterol   ? History of colon polyps   ? ? ?Past Surgical History:  ?Procedure Laterality Date  ? APPENDECTOMY    ? COLONOSCOPY    ? found polyps over 10 years ago  ? ESOPHAGOGASTRODUODENOSCOPY    ? over 10 years ago thinks he did  ? HAND SURGERY Right   ? ? ? ?Current Outpatient Medications  ?Medication Sig Dispense Refill  ? Blood Glucose Monitoring Suppl (TRUE METRIX METER) w/Device KIT See admin instructions.    ? glipiZIDE (GLUCOTROL) 10 MG tablet Take 10 mg by mouth 2 (two) times daily before a meal.    ? metFORMIN (GLUCOPHAGE-XR) 500 MG 24 hr tablet Take 1,000 mg by mouth 2 (two) times daily.    ? metoprolol tartrate (LOPRESSOR) 50 MG tablet Take 50 mg 2 hours before Coronary CT 1 tablet 0  ? OZEMPIC, 0.25 OR 0.5 MG/DOSE, 2 MG/1.5ML SOPN Inject 0.5 mg into the  skin once a week.    ? pantoprazole (PROTONIX) 40 MG tablet Take 40 mg by mouth 2 (two) times daily.    ? pioglitazone (ACTOS) 30 MG tablet Take 30 mg by mouth daily.    ? rosuvastatin (CRESTOR) 20 MG tablet Take 20 mg by mouth daily.    ? TRUE METRIX BLOOD GLUCOSE TEST test strip daily.    ? ?No current facility-administered medications for this visit.  ? ? ?Allergies:   Ciprofloxacin  ? ? ?Social History:  The patient  reports that he has never smoked. He has never used smokeless tobacco. He reports current alcohol use. He reports that he does not currently use drugs.  ? ?Family History:  The patient's family history includes Arrhythmia in his sister; Breast cancer in his sister; Diabetes in his mother; Heart attack in his brother; Heart disease in his brother and mother; Heart failure in his son; Hyperlipidemia in his mother; Hypertension in his mother; Kidney cancer in his sister; Stroke in his father.  ? ? ?ROS:  Please see the history of present illness.   Otherwise, review of systems are positive for none.   All other systems are reviewed and negative.  ? ? ?PHYSICAL EXAM: ?VS:  BP (!) 161/76   Pulse   61   Ht 6' (1.829 m)   Wt 204 lb (92.5 kg)   SpO2 98%   BMI 27.67 kg/m?  , BMI Body mass index is 27.67 kg/m?. ?On my exam  ?Supine BP 142/70 ?Sitting BP 140/70 ?Standing 1 minute 138/70 ?Standing 3 minutes 128/64 ? ?Gen: Well nourished, well developed, in no acute distress ?HEENT: normal ?Neck: no JVD, carotid bruits, or masses ?Cardiac: RRR; no murmurs, rubs, or gallops,no edema  ?Respiratory:  clear to auscultation bilaterally, normal work of breathing ?GI: soft, nontender, nondistended, + BS ?MS: no deformity or atrophy ?Skin: warm and dry, no rash ?Neuro:  Strength and sensation are intact ?Psych: euthymic mood, full affect ? ? ?EKG:  EKG is ordered today. ?The ekg ordered today demonstrates NSR rate 61. Normal. I have personally reviewed and interpreted this study. ? ? ? ?Recent Labs: ?11/13/2021: ALT  19; BUN 33; Creatinine, Ser 1.43; Hemoglobin 13.8; Platelets 214.0; Potassium 4.9; Sodium 137; TSH 3.04  ? ? ?Lipid Panel ?No results found for: CHOL, TRIG, HDL, CHOLHDL, VLDL, LDLCALC, LDLDIRECT ?  ? ?Wt Readings from Last 3 Encounters:  ?12/26/21 204 lb (92.5 kg)  ?11/22/21 202 lb (91.6 kg)  ?11/13/21 202 lb 4 oz (91.7 kg)  ?  ? ? ?Other studies Reviewed: ?Additional studies/ records that were reviewed today include: none. ?Review of the above records demonstrates: N/A ? ? ?ASSESSMENT AND PLAN: ? ?1.  Lightheadedness and dizziness. Somewhat positional. Has been associated with low BP. Not that orthostatic today. Suspect he has some degree of autonomic dysfunction related to his DM. Agree with stopping lisinopril. Liberalize sodium in diet. Focus on maintaining good hydration, support hose. Sleep with some incline. ?2. Exertional neck and should pain. Will proceed with coronary CTA to establish CV risk given multiple risk factors ?3. HLD. Will update lipid panel.  ?4. Renal insufficieny. Creatinine 1.41. will repeat now that off ACEi ? ?Will also request prior records from [place] including results of stress test, Echo, monitor and prior labs.  ? ? ?Current medicines are reviewed at length with the patient today.  The patient does not have concerns regarding medicines. ? ?The following changes have been made:  no change ? ?Labs/ tests ordered today include:  ? ?Orders Placed This Encounter  ?Procedures  ? CT CORONARY MORPH W/CTA COR W/SCORE W/CA W/CM &/OR WO/CM  ? Basic metabolic panel  ? Lipid panel  ? EKG 12-Lead  ? ? ?   ? ? ?Disposition:   FU with me in after above studies. ? ?Signed, ?Kaytlin Burklow, MD  ?12/26/2021 10:41 AM    ?[facility] ?[address], [place], [place], [address] ?Phone [phone], Fax [phone] ? ? ?

## 2021-12-26 ENCOUNTER — Encounter: Payer: Self-pay | Admitting: Cardiology

## 2021-12-26 ENCOUNTER — Ambulatory Visit (INDEPENDENT_AMBULATORY_CARE_PROVIDER_SITE_OTHER): Payer: Medicare HMO | Admitting: Cardiology

## 2021-12-26 VITALS — BP 161/76 | HR 61 | Ht 72.0 in | Wt 204.0 lb

## 2021-12-26 DIAGNOSIS — R42 Dizziness and giddiness: Secondary | ICD-10-CM | POA: Diagnosis not present

## 2021-12-26 DIAGNOSIS — R079 Chest pain, unspecified: Secondary | ICD-10-CM | POA: Diagnosis not present

## 2021-12-26 DIAGNOSIS — E785 Hyperlipidemia, unspecified: Secondary | ICD-10-CM

## 2021-12-26 DIAGNOSIS — R072 Precordial pain: Secondary | ICD-10-CM | POA: Diagnosis not present

## 2021-12-26 DIAGNOSIS — E119 Type 2 diabetes mellitus without complications: Secondary | ICD-10-CM | POA: Diagnosis not present

## 2021-12-26 MED ORDER — METOPROLOL TARTRATE 50 MG PO TABS: ORAL_TABLET | ORAL | 0 refills | Status: DC

## 2021-12-26 NOTE — Patient Instructions (Addendum)
Medication Instructions:  ?Continue same medications ?*If you need a refill on your cardiac medications before your next appointment, please call your pharmacy* ? ? ?Lab Work: ?Bmet,lipid panel today ? ? ?Testing/Procedures: ?Coronary CT  will be scheduled after approved by insurance ? ? ?Follow-Up: ?At [facility], you and your health needs are our priority.  As part of our continuing mission to provide you with exceptional heart care, we have created designated Provider Care Teams.  These Care Teams include your primary Cardiologist (physician) and Advanced Practice Providers (APPs -  Physician Assistants and Nurse Practitioners) who all work together to provide you with the care you need, when you need it. ? ?We recommend signing up for the patient portal called "MyChart".  Sign up information is provided on this After Visit Summary.  MyChart is used to connect with patients for Virtual Visits (Telemedicine).  Patients are able to view lab/test results, encounter notes, upcoming appointments, etc.  Non-urgent messages can be sent to your provider as well.   ?To learn more about what you can do with MyChart, go to [email or web address].   ? ?Your next appointment:  After Coronary CT ?  ? ?The format for your next appointment: Office ? ? ?Provider:  Dr.Jordan ? ? ? ? ?Your cardiac CT will be scheduled at one of the below locations:  ? ?[facility] ?[address] ?[place], [place] [address] ?[phone] ? ?OR ? ?[facility] ?[address] ?[address] ?[place], [place] [address] ?[phone] ? ?If scheduled at [facility], please arrive at the [facility] and Children's Entrance (Entrance C2) of [facility] 30 minutes prior to test start time. ?You can use the FREE valet parking offered at entrance C (encouraged to control the heart rate for the test)  ?Proceed to the [facility] Radiology Department (first floor) to check-in and test prep. ? ?All  radiology patients and guests should use entrance C2 at [facility], accessed from [facility], even though the hospital's physical address listed is [address] ? ? ? ?If scheduled at [facility], please arrive 15 mins early for check-in and test prep. ? ?Please follow these instructions carefully (unless otherwise directed): ? ?Hold all erectile dysfunction medications at least 3 days (72 hrs) prior to test. ? ?On the Night Before the Test: ?Be sure to Drink plenty of water. ?Do not consume any caffeinated/decaffeinated beverages or chocolate 12 hours prior to your test. ?Do not take any antihistamines 12 hours prior to your test. ? ? ?On the Day of the Test: ?Drink plenty of water until 1 hour prior to the test. ?Do not eat any food 4 hours prior to the test. ?You may take your regular medications prior to the test.  ?Take metoprolol 50 mg two hours prior to test. ? ? ? ?     ?After the Test: ?Drink plenty of water. ?After receiving IV contrast, you may experience a mild flushed feeling. This is normal. ?On occasion, you may experience a mild rash up to 24 hours after the test. This is not dangerous. If this occurs, you can take Benadryl 25 mg and increase your fluid intake. ?If you experience trouble breathing, this can be serious. If it is severe call 911 IMMEDIATELY. If it is mild, please call our office. ?If you take any of these medications: Glipizide/Metformin, Avandament, Glucavance, please do not take 48 hours after completing test unless otherwise instructed. ? ?We will  call to schedule your test 2-4 weeks out understanding that some insurance companies will need an authorization prior to the service being performed.  ? ?For non-scheduling related questions, please contact the cardiac imaging nurse navigator should you have any questions/concerns: ?Rockwell Alexandria, Cardiac Imaging Nurse Navigator ?Larey Brick, Cardiac Imaging Nurse  Navigator ?[facility] ?Direct Office Dial: [phone]  ? ?For scheduling needs, including cancellations and rescheduling, please call [place], [phone]. ? ? ?Important Information About Sugar ? ? ? ? ? ? ?

## 2021-12-27 ENCOUNTER — Other Ambulatory Visit: Payer: Self-pay | Admitting: Cardiology

## 2021-12-27 LAB — BASIC METABOLIC PANEL
BUN/Creatinine Ratio: 18 (ref 10–24)
BUN: 17 mg/dL (ref 8–27)
CO2: 22 mmol/L (ref 20–29)
Calcium: 8.4 mg/dL — ABNORMAL LOW (ref 8.6–10.2)
Chloride: 104 mmol/L (ref 96–106)
Creatinine, Ser: 0.95 mg/dL (ref 0.76–1.27)
Glucose: 248 mg/dL — ABNORMAL HIGH (ref 70–99)
Potassium: 4.7 mmol/L (ref 3.5–5.2)
Sodium: 140 mmol/L (ref 134–144)
eGFR: 87 mL/min/{1.73_m2} (ref >59)

## 2021-12-27 LAB — LIPID PANEL
Chol/HDL Ratio: 3 ratio (ref 0.0–5.0)
Cholesterol, Total: 135 mg/dL (ref 100–199)
HDL: 45 mg/dL (ref >39)
LDL Chol Calc (NIH): 70 mg/dL (ref 0–99)
Triglycerides: 111 mg/dL (ref 0–149)
VLDL Cholesterol Cal: 20 mg/dL (ref 5–40)

## 2022-01-07 ENCOUNTER — Telehealth (HOSPITAL_COMMUNITY): Payer: Self-pay | Admitting: *Deleted

## 2022-01-07 NOTE — Telephone Encounter (Signed)
Reaching out to patient to offer assistance regarding upcoming cardiac imaging study; pt verbalizes understanding of appt date/time, parking situation and where to check in, pre-test NPO status and medications ordered, and verified current allergies; name and call back number provided for further questions should they arise ? ?Gordy Clement RN Navigator Cardiac Imaging ?[facility] ?[phone] office ?[phone] cell ? ?Patient to take 50mg  metoprolol tartrate two hours prior to his cardiac CT Scan.  He is aware to arrive 9:30am. States his HR is usually in the 70's-80s. ?

## 2022-01-09 ENCOUNTER — Other Ambulatory Visit: Payer: Self-pay | Admitting: Cardiology

## 2022-01-09 ENCOUNTER — Encounter (HOSPITAL_COMMUNITY): Payer: Self-pay

## 2022-01-09 ENCOUNTER — Ambulatory Visit (HOSPITAL_BASED_OUTPATIENT_CLINIC_OR_DEPARTMENT_OTHER)
Admission: RE | Admit: 2022-01-09 | Discharge: 2022-01-09 | Disposition: A | Discharge status: Home / Self Care | Payer: Medicare HMO | Source: Ambulatory Visit | Attending: Cardiology | Admitting: Cardiology

## 2022-01-09 ENCOUNTER — Ambulatory Visit (HOSPITAL_COMMUNITY)
Admission: RE | Admit: 2022-01-09 | Discharge: 2022-01-09 | Disposition: A | Discharge status: Home / Self Care | Payer: Medicare HMO | Source: Ambulatory Visit | Attending: Cardiology | Admitting: Cardiology

## 2022-01-09 DIAGNOSIS — R931 Abnormal findings on diagnostic imaging of heart and coronary circulation: Secondary | ICD-10-CM

## 2022-01-09 DIAGNOSIS — R079 Chest pain, unspecified: Secondary | ICD-10-CM | POA: Diagnosis not present

## 2022-01-09 DIAGNOSIS — R072 Precordial pain: Secondary | ICD-10-CM | POA: Diagnosis not present

## 2022-01-09 DIAGNOSIS — R42 Dizziness and giddiness: Secondary | ICD-10-CM | POA: Insufficient documentation

## 2022-01-09 DIAGNOSIS — E785 Hyperlipidemia, unspecified: Secondary | ICD-10-CM | POA: Diagnosis not present

## 2022-01-09 DIAGNOSIS — E119 Type 2 diabetes mellitus without complications: Secondary | ICD-10-CM | POA: Diagnosis not present

## 2022-01-09 DIAGNOSIS — I2510 Atherosclerotic heart disease of native coronary artery without angina pectoris: Secondary | ICD-10-CM

## 2022-01-09 MED ORDER — METOPROLOL TARTRATE 5 MG/5ML IV SOLN
INTRAVENOUS | Status: AC
  Administered 2022-01-09: 5 mg via INTRAVENOUS
  Filled 2022-01-09: qty 5

## 2022-01-09 MED ORDER — NITROGLYCERIN 0.4 MG SL SUBL
0.8000 mg | SUBLINGUAL_TABLET | Freq: Once | SUBLINGUAL | Status: AC
  Administered 2022-01-09: 0.8 mg via SUBLINGUAL

## 2022-01-09 MED ORDER — NITROGLYCERIN 0.4 MG SL SUBL
SUBLINGUAL_TABLET | SUBLINGUAL | Status: AC
  Filled 2022-01-09: qty 2

## 2022-01-09 MED ORDER — IOHEXOL 350 MG/ML SOLN
100.0000 mL | Freq: Once | INTRAVENOUS | Status: AC | PRN
  Administered 2022-01-09: 100 mL via INTRAVENOUS

## 2022-01-09 MED ORDER — METOPROLOL TARTRATE 5 MG/5ML IV SOLN: 5.0000 mg | Freq: Once | INTRAVENOUS | Status: AC

## 2022-01-10 DIAGNOSIS — R931 Abnormal findings on diagnostic imaging of heart and coronary circulation: Secondary | ICD-10-CM | POA: Diagnosis not present

## 2022-01-10 DIAGNOSIS — I2510 Atherosclerotic heart disease of native coronary artery without angina pectoris: Secondary | ICD-10-CM | POA: Diagnosis not present

## 2022-01-11 ENCOUNTER — Other Ambulatory Visit: Payer: Self-pay

## 2022-01-11 ENCOUNTER — Telehealth: Payer: Self-pay

## 2022-01-11 DIAGNOSIS — Z01812 Encounter for preprocedural laboratory examination: Secondary | ICD-10-CM

## 2022-01-11 DIAGNOSIS — R079 Chest pain, unspecified: Secondary | ICD-10-CM

## 2022-01-11 DIAGNOSIS — R072 Precordial pain: Secondary | ICD-10-CM

## 2022-01-11 NOTE — Telephone Encounter (Signed)
?  [facility] ?[facility] ?[address] ?[place] [address] ?Dept: [phone] ?Loc: [phone] ? ?Sebastyan Snodgrass  01/11/2022 ? ?You are scheduled for a Cardiac Catheterization on Tuesday, May 23 with Dr. Peter [place]. ? ?1. Please arrive at the Main Entrance A at [facility]: [address] [place], [place] [address] at 8:30 AM (This time is two hours before your procedure to ensure your preparation). Free valet parking service is available.  ? ?Special note: Every effort is made to have your procedure done on time. Please understand that emergencies sometimes delay scheduled procedures. ? ?2. Diet: Do not eat solid foods after midnight.  You may have clear liquids until 5 AM upon the day of the procedure. ? ?3. Labs: You will need to have blood drawn on Tuesday 01/15/22 at Dr.Jordan's office [facility]. You do not need to be fasting. ? ?4. Medication instructions in preparation for your procedure: ? ? ? ?Hold Glipizide morning of Cath ? ?Hold Metformin morning of Cath and Hold 2 days after Cath. ? ? ? ? ? ?On the morning of your procedure, take Aspirin 81 mg and any morning medicines NOT listed above.  You may use sips of water. ? ?5. Plan to go home the same day, you will only stay overnight if medically necessary. ?6. You MUST have a responsible adult to drive you home. ?7. An adult MUST be with you the first 24 hours after you arrive home. ?8. Bring a current list of your medications, and the last time and date medication taken. ?9. Bring ID and current insurance cards. ?10.Please wear clothes that are easy to get on and off and wear slip-on shoes. ? ?Thank you for allowing [place] to care for you! ?  -- [facility] ? ?

## 2022-01-14 ENCOUNTER — Other Ambulatory Visit: Payer: Self-pay | Admitting: Cardiology

## 2022-01-14 DIAGNOSIS — I209 Angina pectoris, unspecified: Secondary | ICD-10-CM

## 2022-01-14 MED ORDER — SODIUM CHLORIDE 0.9% FLUSH: 3.0000 mL | Freq: Two times a day (BID) | INTRAVENOUS | Status: DC

## 2022-01-15 DIAGNOSIS — Z01812 Encounter for preprocedural laboratory examination: Secondary | ICD-10-CM | POA: Diagnosis not present

## 2022-01-15 DIAGNOSIS — R072 Precordial pain: Secondary | ICD-10-CM | POA: Diagnosis not present

## 2022-01-16 LAB — CBC WITH DIFFERENTIAL/PLATELET
Basophils Absolute: 0.1 10*3/uL (ref 0.0–0.2)
Basos: 1 %
EOS (ABSOLUTE): 0.1 10*3/uL (ref 0.0–0.4)
Eos: 2 %
Hematocrit: 36.7 % — ABNORMAL LOW (ref 37.5–51.0)
Hemoglobin: 12.2 g/dL — ABNORMAL LOW (ref 13.0–17.7)
Immature Grans (Abs): 0 10*3/uL (ref 0.0–0.1)
Immature Granulocytes: 0 %
Lymphocytes Absolute: 1.1 10*3/uL (ref 0.7–3.1)
Lymphs: 27 %
MCH: 29.5 pg (ref 26.6–33.0)
MCHC: 33.2 g/dL (ref 31.5–35.7)
MCV: 89 fL (ref 79–97)
Monocytes Absolute: 0.3 10*3/uL (ref 0.1–0.9)
Monocytes: 8 %
Neutrophils Absolute: 2.6 10*3/uL (ref 1.4–7.0)
Neutrophils: 62 %
Platelets: 212 10*3/uL (ref 150–450)
RBC: 4.14 x10E6/uL (ref 4.14–5.80)
RDW: 13 % (ref 11.6–15.4)
WBC: 4.2 10*3/uL (ref 3.4–10.8)

## 2022-01-16 LAB — PT AND PTT
INR: 1 (ref 0.9–1.2)
Prothrombin Time: 10.7 s (ref 9.1–12.0)
aPTT: 26 s (ref 24–33)

## 2022-01-16 LAB — BASIC METABOLIC PANEL
BUN/Creatinine Ratio: 20 (ref 10–24)
BUN: 24 mg/dL (ref 8–27)
CO2: 23 mmol/L (ref 20–29)
Calcium: 9 mg/dL (ref 8.6–10.2)
Chloride: 104 mmol/L (ref 96–106)
Creatinine, Ser: 1.23 mg/dL (ref 0.76–1.27)
Glucose: 207 mg/dL — ABNORMAL HIGH (ref 70–99)
Potassium: 5.1 mmol/L (ref 3.5–5.2)
Sodium: 140 mmol/L (ref 134–144)
eGFR: 64 mL/min/{1.73_m2} (ref >59)

## 2022-01-21 ENCOUNTER — Telehealth: Payer: Self-pay | Admitting: *Deleted

## 2022-01-21 NOTE — Telephone Encounter (Addendum)
Cardiac Catheterization scheduled at [facility] for: Tuesday Jan 22, 2022 10:30 AM Arrival time and place: [place] Entrance A at: 8:30 AM   Nothing to eat after midnight prior to procedure, clear liquids until 5 AM day of procedure.  Medication instructions: -Hold:  Metformin-day of procedure and 48 hours post procedure  Glipizide/Actos-AM of procedure -Except hold medications usual morning medications can be taken with sips of water including aspirin 81 mg.  Confirmed patient has responsible adult to drive home post procedure and be with patient first 24 hours after arriving home.  Patient reports no new symptoms concerning for COVID-19/no exposure to COVID-19 in the past 10 days.  Reviewed procedure instructions with patient.

## 2022-01-22 ENCOUNTER — Other Ambulatory Visit (HOSPITAL_COMMUNITY): Payer: Self-pay

## 2022-01-22 ENCOUNTER — Ambulatory Visit (HOSPITAL_COMMUNITY)
Admission: RE | Admit: 2022-01-22 | Discharge: 2022-01-22 | Disposition: A | Discharge status: Home / Self Care | Payer: Medicare HMO | Attending: Cardiology | Admitting: Cardiology

## 2022-01-22 ENCOUNTER — Ambulatory Visit (HOSPITAL_COMMUNITY)
Admission: RE | Disposition: A | Discharge status: Home / Self Care | Payer: Self-pay | Source: Home / Self Care | Attending: Cardiology

## 2022-01-22 DIAGNOSIS — Z7984 Long term (current) use of oral hypoglycemic drugs: Secondary | ICD-10-CM | POA: Diagnosis not present

## 2022-01-22 DIAGNOSIS — E7800 Pure hypercholesterolemia, unspecified: Secondary | ICD-10-CM | POA: Diagnosis not present

## 2022-01-22 DIAGNOSIS — I209 Angina pectoris, unspecified: Secondary | ICD-10-CM

## 2022-01-22 DIAGNOSIS — I10 Essential (primary) hypertension: Secondary | ICD-10-CM | POA: Insufficient documentation

## 2022-01-22 DIAGNOSIS — R42 Dizziness and giddiness: Secondary | ICD-10-CM | POA: Insufficient documentation

## 2022-01-22 DIAGNOSIS — Z8249 Family history of ischemic heart disease and other diseases of the circulatory system: Secondary | ICD-10-CM | POA: Diagnosis not present

## 2022-01-22 DIAGNOSIS — E119 Type 2 diabetes mellitus without complications: Secondary | ICD-10-CM | POA: Diagnosis not present

## 2022-01-22 DIAGNOSIS — Z955 Presence of coronary angioplasty implant and graft: Secondary | ICD-10-CM

## 2022-01-22 DIAGNOSIS — I25119 Atherosclerotic heart disease of native coronary artery with unspecified angina pectoris: Secondary | ICD-10-CM | POA: Diagnosis not present

## 2022-01-22 HISTORY — PX: CORONARY STENT INTERVENTION: CATH118234

## 2022-01-22 HISTORY — PX: LEFT HEART CATH AND CORONARY ANGIOGRAPHY: CATH118249

## 2022-01-22 LAB — GLUCOSE, CAPILLARY: Glucose-Capillary: 178 mg/dL — ABNORMAL HIGH (ref 70–99)

## 2022-01-22 SURGERY — LEFT HEART CATH AND CORONARY ANGIOGRAPHY
Anesthesia: LOCAL

## 2022-01-22 MED ORDER — SODIUM CHLORIDE 0.9 % WEIGHT BASED INFUSION: 1.0000 mL/kg/h | INTRAVENOUS | Status: DC

## 2022-01-22 MED ORDER — SODIUM CHLORIDE 0.9% FLUSH: 3.0000 mL | INTRAVENOUS | Status: DC | PRN

## 2022-01-22 MED ORDER — LABETALOL HCL 5 MG/ML IV SOLN: 10.0000 mg | INTRAVENOUS | Status: DC | PRN

## 2022-01-22 MED ORDER — FENTANYL CITRATE (PF) 100 MCG/2ML IJ SOLN
INTRAMUSCULAR | Status: DC | PRN
  Administered 2022-01-22: 25 ug via INTRAVENOUS

## 2022-01-22 MED ORDER — HEPARIN SODIUM (PORCINE) 1000 UNIT/ML IJ SOLN
INTRAMUSCULAR | Status: AC
  Filled 2022-01-22: qty 10

## 2022-01-22 MED ORDER — FENTANYL CITRATE (PF) 100 MCG/2ML IJ SOLN
INTRAMUSCULAR | Status: AC
  Filled 2022-01-22: qty 2

## 2022-01-22 MED ORDER — NITROGLYCERIN 0.4 MG SL SUBL
0.4000 mg | SUBLINGUAL_TABLET | SUBLINGUAL | 12 refills | Status: DC | PRN
  Filled 2022-01-22: qty 25, 7d supply, fill #0

## 2022-01-22 MED ORDER — CLOPIDOGREL BISULFATE 75 MG PO TABS: 75.0000 mg | ORAL_TABLET | Freq: Every day | ORAL | Status: DC

## 2022-01-22 MED ORDER — HEPARIN SODIUM (PORCINE) 1000 UNIT/ML IJ SOLN
INTRAMUSCULAR | Status: DC | PRN
Start: 2022-01-22 — End: 2022-01-23
  Administered 2022-01-22: 5000 [IU] via INTRAVENOUS
  Administered 2022-01-22: 4000 [IU] via INTRAVENOUS

## 2022-01-22 MED ORDER — LIDOCAINE HCL (PF) 1 % IJ SOLN
INTRAMUSCULAR | Status: AC
  Filled 2022-01-22: qty 30

## 2022-01-22 MED ORDER — ASPIRIN 81 MG PO CHEW: 81.0000 mg | CHEWABLE_TABLET | ORAL | Status: DC

## 2022-01-22 MED ORDER — ACETAMINOPHEN 325 MG PO TABS: 650.0000 mg | ORAL_TABLET | ORAL | Status: DC | PRN

## 2022-01-22 MED ORDER — MIDAZOLAM HCL 2 MG/2ML IJ SOLN
INTRAMUSCULAR | Status: DC | PRN
  Administered 2022-01-22: 2 mg via INTRAVENOUS

## 2022-01-22 MED ORDER — SODIUM CHLORIDE 0.9 % WEIGHT BASED INFUSION
3.0000 mL/kg/h | INTRAVENOUS | Status: AC
  Administered 2022-01-22: 3 mL/kg/h via INTRAVENOUS

## 2022-01-22 MED ORDER — SODIUM CHLORIDE 0.9 % IV SOLN: 250.0000 mL | INTRAVENOUS | Status: DC | PRN

## 2022-01-22 MED ORDER — ONDANSETRON HCL 4 MG/2ML IJ SOLN: 4.0000 mg | Freq: Four times a day (QID) | INTRAMUSCULAR | Status: DC | PRN

## 2022-01-22 MED ORDER — IOHEXOL 350 MG/ML SOLN
INTRAVENOUS | Status: DC | PRN
  Administered 2022-01-22: 110 mL

## 2022-01-22 MED ORDER — FAMOTIDINE IN NACL 20-0.9 MG/50ML-% IV SOLN
INTRAVENOUS | Status: DC | PRN
Start: 2022-01-22 — End: 2022-01-23
  Administered 2022-01-22: 20 mg via INTRAVENOUS

## 2022-01-22 MED ORDER — NITROGLYCERIN 1 MG/10 ML FOR IR/CATH LAB
INTRA_ARTERIAL | Status: DC | PRN
  Administered 2022-01-22: 200 ug

## 2022-01-22 MED ORDER — SODIUM CHLORIDE 0.9% FLUSH: 3.0000 mL | Freq: Two times a day (BID) | INTRAVENOUS | Status: DC

## 2022-01-22 MED ORDER — HEPARIN (PORCINE) IN NACL 1000-0.9 UT/500ML-% IV SOLN
INTRAVENOUS | Status: AC
  Filled 2022-01-22: qty 1000

## 2022-01-22 MED ORDER — CLOPIDOGREL BISULFATE 75 MG PO TABS
75.0000 mg | ORAL_TABLET | Freq: Every day | ORAL | 1 refills | Status: DC
  Filled 2022-01-22: qty 90, 90d supply, fill #0

## 2022-01-22 MED ORDER — NITROGLYCERIN 1 MG/10 ML FOR IR/CATH LAB
INTRA_ARTERIAL | Status: AC
  Filled 2022-01-22: qty 10

## 2022-01-22 MED ORDER — ASPIRIN 81 MG PO TBEC
81.0000 mg | DELAYED_RELEASE_TABLET | Freq: Every day | ORAL | 3 refills | Status: AC
  Filled 2022-01-22: qty 90, 90d supply, fill #0

## 2022-01-22 MED ORDER — CLOPIDOGREL BISULFATE 300 MG PO TABS
ORAL_TABLET | ORAL | Status: AC
  Filled 2022-01-22: qty 2

## 2022-01-22 MED ORDER — MIDAZOLAM HCL 2 MG/2ML IJ SOLN
INTRAMUSCULAR | Status: AC
Start: 2022-01-22 — End: ?
  Filled 2022-01-22: qty 2

## 2022-01-22 MED ORDER — CLOPIDOGREL BISULFATE 300 MG PO TABS
ORAL_TABLET | ORAL | Status: DC | PRN
Start: 2022-01-22 — End: 2022-01-23
  Administered 2022-01-22: 600 mg via ORAL

## 2022-01-22 MED ORDER — HYDRALAZINE HCL 20 MG/ML IJ SOLN: 10.0000 mg | INTRAMUSCULAR | Status: DC | PRN

## 2022-01-22 MED ORDER — ASPIRIN 81 MG PO CHEW: 81.0000 mg | CHEWABLE_TABLET | Freq: Every day | ORAL | Status: DC

## 2022-01-22 MED ORDER — HEPARIN (PORCINE) IN NACL 1000-0.9 UT/500ML-% IV SOLN
INTRAVENOUS | Status: DC | PRN
  Administered 2022-01-22 (×2): 500 mL

## 2022-01-22 MED ORDER — VERAPAMIL HCL 2.5 MG/ML IV SOLN
INTRAVENOUS | Status: AC
  Filled 2022-01-22: qty 2

## 2022-01-22 MED ORDER — FAMOTIDINE IN NACL 20-0.9 MG/50ML-% IV SOLN
INTRAVENOUS | Status: AC
  Filled 2022-01-22: qty 50

## 2022-01-22 MED ORDER — ROSUVASTATIN CALCIUM 40 MG PO TABS
40.0000 mg | ORAL_TABLET | Freq: Every day | ORAL | 6 refills | Status: DC
  Filled 2022-01-22: qty 30, 30d supply, fill #0

## 2022-01-22 SURGICAL SUPPLY — 21 items
BALL SAPPHIRE NC24 3.75X12 (BALLOONS) ×2
BALLN SAPPHIRE 2.5X12 (BALLOONS) ×2
BALLOON SAPPHIRE 2.5X12 (BALLOONS) IMPLANT
BALLOON SAPPHIRE NC24 3.75X12 (BALLOONS) IMPLANT
BAND CMPR LRG ZPHR (HEMOSTASIS) ×1
BAND ZEPHYR COMPRESS 30 LONG (HEMOSTASIS) ×1 IMPLANT
CATH 5FR JL3.5 JR4 ANG PIG MP (CATHETERS) ×1 IMPLANT
CATH VISTA GUIDE 6FR JR4 (CATHETERS) ×1 IMPLANT
GLIDESHEATH SLEND SS 6F .021 (SHEATH) ×1 IMPLANT
GUIDEWIRE INQWIRE 1.5J.035X260 (WIRE) IMPLANT
INQWIRE 1.5J .035X260CM (WIRE) ×2
KIT ENCORE 26 ADVANTAGE (KITS) ×1 IMPLANT
KIT HEART LEFT (KITS) ×2 IMPLANT
KIT HEMO VALVE WATCHDOG (MISCELLANEOUS) ×1 IMPLANT
PACK CARDIAC CATHETERIZATION (CUSTOM PROCEDURE TRAY) ×2 IMPLANT
STENT SYNERGY XD 3.50X32 (Permanent Stent) IMPLANT
SYNERGY XD 3.50X32 (Permanent Stent) ×2 IMPLANT
SYR MEDRAD MARK 7 150ML (SYRINGE) ×2 IMPLANT
TRANSDUCER W/STOPCOCK (MISCELLANEOUS) ×2 IMPLANT
TUBING CIL FLEX 10 FLL-RA (TUBING) ×2 IMPLANT
WIRE ASAHI PROWATER 180CM (WIRE) ×1 IMPLANT

## 2022-01-22 NOTE — Progress Notes (Signed)
Pt stable without complications.  All discharge teaching completed.  Pt and wife verbalize understanding and deny further questions.  Report given to [facility] who will assume care at this time

## 2022-01-22 NOTE — Discharge Instructions (Signed)
Radial Site Care  This sheet gives you information about how to care for yourself after your procedure. Your health care provider may also give you more specific instructions. If you have problems or questions, contact your health care provider. What can I expect after the procedure? After the procedure, it is common to have: Bruising and tenderness at the catheter insertion area. Follow these instructions at home: Medicines Take over-the-counter and prescription medicines only as told by your health care provider. Insertion site care Follow instructions from your health care provider about how to take care of your insertion site. Make sure you: Wash your hands with soap and water before you remove your bandage (dressing). If soap and water are not available, use hand sanitizer. May remove dressing in 24 hours. Check your insertion site every day for signs of infection. Check for: Redness, swelling, or pain. Fluid or blood. Pus or a bad smell. Warmth. Do no take baths, swim, or use a hot tub for 5 days. You may shower 24-48 hours after the procedure. Remove the dressing and gently wash the site with plain soap and water. Pat the area dry with a clean towel. Do not rub the site. That could cause bleeding. Do not apply powder or lotion to the site. Activity  For 24 hours after the procedure, or as directed by your health care provider: Do not flex or bend the affected arm. Do not push or pull heavy objects with the affected arm. Do not drive yourself home from the hospital or clinic. You may drive 24 hours after the procedure. Do not operate machinery or power tools. KEEP ARM ELEVATED THE REMAINDER OF THE DAY. Do not push, pull or lift anything that is heavier than 10 lb for 5 days. Ask your health care provider when it is okay to: Return to work or school. Resume usual physical activities or sports. Resume sexual activity. General instructions If the catheter site starts to  bleed, raise your arm and put firm pressure on the site. If the bleeding does not stop, get help right away. This is a medical emergency. DRINK PLENTY OF FLUIDS FOR THE NEXT 2-3 DAYS. No alcohol consumption for 24 hours after receiving sedation. If you went home on the same day as your procedure, a responsible adult should be with you for the first 24 hours after you arrive home. Keep all follow-up visits as told by your health care provider. This is important. Contact a health care provider if: You have a fever. You have redness, swelling, or yellow drainage around your insertion site. Get help right away if: You have unusual pain at the radial site. The catheter insertion area swells very fast. The insertion area is bleeding, and the bleeding does not stop when you hold steady pressure on the area. Your arm or hand becomes pale, cool, tingly, or numb. These symptoms may represent a serious problem that is an emergency. Do not wait to see if the symptoms will go away. Get medical help right away. Call your local emergency services (911 in the [place] Do not drive yourself to the hospital. Summary After the procedure, it is common to have bruising and tenderness at the site. Follow instructions from your health care provider about how to take care of your radial site wound. Check the wound every day for signs of infection.  This information is not intended to replace advice given to you by your health care provider. Make sure you discuss any questions you have with   your health care provider. Document Revised: 09/24/2017 Document Reviewed: 09/24/2017 Elsevier Patient Education  2020 Elsevier Inc.  

## 2022-01-22 NOTE — Interval H&P Note (Signed)
History and Physical Interval Note:  01/22/2022 12:53 PM  Ivan Dickson  has presented today for surgery, with the diagnosis of abnormal cardiac CT, chest pain.  The various methods of treatment have been discussed with the patient and family. After consideration of risks, benefits and other options for treatment, the patient has consented to  Procedure(s): LEFT HEART CATH AND CORONARY ANGIOGRAPHY (N/A) as a surgical intervention.  The patient's history has been reviewed, patient examined, no change in status, stable for surgery.  I have reviewed the patient's chart and labs.  Questions were answered to the patient's satisfaction.   Cath Lab Visit (complete for each Cath Lab visit)  Clinical Evaluation Leading to the Procedure:   ACS: No.  Non-ACS:    Anginal Classification: CCS II  Anti-ischemic medical therapy: Minimal Therapy (1 class of medications)  Non-Invasive Test Results: High-risk stress test findings: cardiac mortality >3%/year  Prior CABG: No previous CABG        Theron Arista [facility] 01/22/2022 12:53 PM

## 2022-01-22 NOTE — Progress Notes (Signed)
CARDIAC REHAB PHASE I   Stent education completed with pt and spouse. Pt educated on importance of ASA and Plavix. Pt given stent card along with heart healthy and diabetic diets. Reviewed site care, restrictions, and exercise guidelines. Will refer to CRP II [place].  [id number] Reynold Bowen, RN BSN 01/22/2022 3:19 PM

## 2022-01-22 NOTE — Discharge Summary (Signed)
Discharge Summary for Same Day PCI   Patient ID: Ivan Dickson MRN: [id number]; DOB: 1951-05-14  Admit date: 01/22/2022 Discharge date: 01/22/2022  Primary Care Provider: Garwin Brothers, MD  Primary Cardiologist: Peter Martinique, MD  Primary Electrophysiologist:  None   Discharge Diagnoses    Active Problems:   Angina pectoris [facility])   CAD  HTN  HLD DM   Diagnostic Studies/Procedures    Cardiac Catheterization 01/22/2022:  CORONARY STENT INTERVENTION  LEFT HEART CATH AND CORONARY ANGIOGRAPHY   Conclusion      Prox LAD to Mid LAD lesion is 50% stenosed.   1st Diag lesion is 70% stenosed.   2nd Diag lesion is 70% stenosed.   Prox RCA to Mid RCA lesion is 60% stenosed.   Mid RCA lesion is 90% stenosed.   A drug-eluting stent was successfully placed using a SYNERGY XD 3.50X32.   Post intervention, there is a 0% residual stenosis.   Post intervention, there is a 0% residual stenosis.   The left ventricular systolic function is normal.   LV end diastolic pressure is normal.   The left ventricular ejection fraction is 55-65% by visual estimate.   There is no mitral valve regurgitation.   Single vessel obstructive CAD Normal LV function Normal LVEDP Successful PCI of the mid RCA with DES x 1   Plan: the proximal LAD does not appear obstructive on Cath ( FFR abnormal on CTA). Will treat medically unless he has significant angina. Continue DAPT for at least 6 months. Anticipate same day DC.    Diagnostic Dominance: Right Intervention     History of Present Illness     Ivan Dickson is a 71 y.o. male with hx of HTN, HLD, DM and family hx of CAD presented for cath.   He reports over past year that he has symptoms of lightheadedness and dizziness when out working in the yard. Worse after squatting or bending over. Noted BP as low as 60/40. Was on lisinopril for renal protection but this has been discontinued. No real change in symptoms. When this happens he stops and rests. No  true syncope.  Does complain of exertional pain in neck and shoulders. Did see a cardiologist a couple of years ago in [place]. Apparently had stress test, Echo, and Zio patch monitor then. Is very concerned about CV risk given risk factors of DM, HLD, and strong family history of CAD.   Follow up coronary CT FFR suggests obstructive CAD in proximal LAD and mid RCA.  Cardiac catheterization was arranged for further evaluation.  Hospital Course     CAD The patient underwent cardiac cath as noted above with single vessel obstructive CAD. S/p successful PCI of the mid RCA with DES x 1. The proximal LAD does not appear obstructive on Cath ( FFR abnormal on CTA). Will treat medically unless he has significant angina.  Plan for DAPT with ASA/Plavix for at least 6 months. The patient was seen by cardiac rehab while in short stay. There were no observed complications post cath. Radial cath site was re-evaluated prior to discharge and found to be stable without any complications. Currently had TR band. Instructions/precautions regarding cath site care were given prior to discharge.  Elevated Blood pressure BP minimally elevated. Previously stopped lisinopril due to dizziness which was concerning for autonomic dysfunction. Keep log of blood pressure and bring reading for evaluation.   HLD 12/26/2021: Cholesterol, Total 135; HDL 45; LDL Chol Calc (NIH) 70; Triglycerides 111  Given CAD, will increase crestor  to 40mg  qd. Lipid panel and LFTs in 8 weeks.   Ivan Dickson was seen by Dr. Martinique and determined stable for discharge home. Follow up with our office has been arranged. Medications are listed below. Pertinent changes include addition of plavix. Consider increasing Crestor.   Cath/PCI Registry Performance & Quality Measures: Aspirin prescribed? - Yes ADP Receptor Inhibitor (Plavix/Clopidogrel, Brilinta/Ticagrelor or Effient/Prasugrel) prescribed (includes medically managed patients)? - Yes High  Intensity Statin (Lipitor 40-80mg  or Crestor 20-40mg ) prescribed? - Yes For EF <40%, was ACEI/ARB prescribed? - Not Applicable (EF >/= 17%) For EF <40%, Aldosterone Antagonist (Spironolactone or Eplerenone) prescribed? - Not Applicable (EF >/= 40%) Cardiac Rehab Phase II ordered (Included Medically managed Patients)? - Yes   Discharge Vitals Blood pressure (!) 144/78, pulse 77, temperature 97.9 F (36.6 C), temperature source Oral, resp. rate 16, height 6' (1.829 m), weight 90.7 kg, SpO2 98 %.  Filed Weights   01/22/22 0836  Weight: 90.7 kg    Last Labs & Radiologic Studies   _____________  CARDIAC CATHETERIZATION  Result Date: 01/22/2022   Prox LAD to Mid LAD lesion is 50% stenosed.   1st Diag lesion is 70% stenosed.   2nd Diag lesion is 70% stenosed.   Prox RCA to Mid RCA lesion is 60% stenosed.   Mid RCA lesion is 90% stenosed.   A drug-eluting stent was successfully placed using a SYNERGY XD 3.50X32.   Post intervention, there is a 0% residual stenosis.   Post intervention, there is a 0% residual stenosis.   The left ventricular systolic function is normal.   LV end diastolic pressure is normal.   The left ventricular ejection fraction is 55-65% by visual estimate.   There is no mitral valve regurgitation. Single vessel obstructive CAD Normal LV function Normal LVEDP Successful PCI of the mid RCA with DES x 1 Plan: the proximal LAD does not appear obstructive on Cath ( FFR abnormal on CTA). Will treat medically unless he has significant angina. Continue DAPT for at least 6 months. Anticipate same day DC.   CT CORONARY MORPH W/CTA COR W/SCORE W/CA W/CM &/OR WO/CM  Addendum Date: 01/09/2022   ADDENDUM REPORT: 01/09/2022 15:20 CLINICAL DATA:  44M with exertional shoulder pain EXAM: Cardiac/Coronary CTA TECHNIQUE: The patient was scanned on a [employer]. FINDINGS: A 100 kV prospective scan was triggered in the descending thoracic aorta at 111 HU's. Axial non-contrast 3 mm slices  were carried out through the heart. The data set was analyzed on a dedicated work station and scored using the [place]. Gantry rotation speed was 250 msecs and collimation was .6 mm. 0.8 mg of sl NTG was given. The 3D data set was reconstructed in 5% intervals of the 35-75% of the R-R cycle. Phases were analyzed on a dedicated work station using MPR, MIP and VRT modes. The patient received 80 cc of contrast. Coronary Arteries:  Normal coronary origin.  Right dominance. RCA is a large dominant artery that gives rise to PDA and PLA. Calcified plaque in the proximal RCA causes 50-69% stenosis. Mixed plaque in the mid RCA causes 70-99% stenosis. Calcified plaque in the distal RCA causes 25-49% stenosis. Left main is a large artery that gives rise to LAD and LCX arteries. LAD is a large vessel. Mixed plaque in the proximal LAD causes 50-69% stenosis. LCX is a non-dominant artery that gives rise to one large OM1 branch. Other findings: Left Ventricle: Normal size Left Atrium: Normal size. PFO Pulmonary Veins: Normal configuration Right Ventricle:  Mild dilatation Right Atrium: Normal size Cardiac valves: Mild AV calcifications Thoracic aorta: Normal size Pulmonary Arteries: Normal size Systemic Veins: Normal drainage Pericardium: Normal thickness IMPRESSION: 1. Coronary calcium score of 1129. This was 88th percentile for age and sex matched control. 2.  Normal coronary origin with right dominance. 3.  Obstructive CAD 4.  Mixed plaque in mid RCA causes severe (70-99%) stenosis 5. Mixed plaque in the proximal LAD causes moderate (50-69%) stenosis. 6.  Will send for CTFFR CAD-RADS 4 Severe stenosis. (70-99% or > 50% left main). Cardiac catheterization or CT FFR is recommended. Consider symptom-guided anti-ischemic pharmacotherapy as well as risk factor modification per guideline directed care. Electronically Signed   By: Oswaldo Milian M.D.   On: 01/09/2022 15:20   Result Date: 01/09/2022 EXAM: OVER-READ  INTERPRETATION  CT CHEST The following report is an over-read performed by radiologist Dr. Dahlia Bailiff of [facility], [place] on 01/09/2022. This over-read does not include interpretation of cardiac or coronary anatomy or pathology. The coronary calcium score/coronary CTA interpretation by the cardiologist is attached. COMPARISON:  None Available. FINDINGS: Vascular: Aortic atherosclerosis. No acute noncardiac vascular finding. Mediastinum/Nodes: No pathologically enlarged mediastinal or hilar lymph nodes within the visualized portions of the thorax. Distal esophagus is grossly unremarkable. Lungs/Pleura: Right lower lobe calcified granuloma. Within the visualized portions of the thorax there are no suspicious pulmonary nodules or masses and no pleural effusion or pneumothorax. Hypoventilatory change in the dependent lungs. Upper Abdomen: No acute abnormality. Musculoskeletal: Thoracic spondylosis. IMPRESSION: No acute noncardiac finding. Aortic Atherosclerosis (ICD10-I70.0). Electronically Signed: By: Dahlia Bailiff M.D. On: 01/09/2022 11:49   CT CORONARY FRACTIONAL FLOW RESERVE DATA PREP  Result Date: 01/10/2022 EXAM: FFRCT ANALYSIS FINDINGS: FFRct analysis was performed on the original cardiac CT angiogram dataset. Diagrammatic representation of the FFRct analysis is provided in a separate PDF document in PACS. This dictation was created using the PDF document and an interactive 3D model of the results. 3D model is not available in the EMR/PACS. Normal FFR range is >0.80. 1. Left Main: No significant stenosis 2. LAD: CTFFR 0.76 across lesion in proximal LAD suggesting lesion is hemodynamically significant 3. LCX: No significant stenosis 4. RCA: CTFFR 0.68 across lesion in mid RCA, suggesting lesion is hemodynamically significant IMPRESSION: 1.  CT FFR suggests obstructive CAD in proximal LAD and mid RCA 2.  Cardiac catheterization recommended Electronically Signed   By: Oswaldo Milian M.D.   On:  01/10/2022 00:42    Disposition   Pt is being discharged home today in good condition.  Follow-up Plans & Appointments     Follow-up Information     Martinique, Peter M, MD Follow up on 02/06/2022.   Specialty: Cardiology Why: $RemoveBeforeD'@9'AJaczaFtHEiOZq$ :40am for follow up Contact information: [address] [place] [address] [phone]                Discharge Instructions     AMB Referral to Cardiac Rehabilitation - Phase II   Complete by: As directed    Diagnosis: Coronary Stents   After initial evaluation and assessments completed: Virtual Based Care may be provided alone or in conjunction with Phase 2 Cardiac Rehab based on patient barriers.: Yes   Diet - low sodium heart healthy   Complete by: As directed    Discharge instructions   Complete by: As directed    No driving for 48 hours. No lifting over 5 lbs for 1 week. No sexual activity for 1 week. You may return to work on  01/28/22. Keep procedure site clean & dry. If you notice increased pain, swelling, bleeding or pus, call/return!  You may shower, but no soaking baths/hot tubs/pools for 1 week.  Hold metformin for 2 days. Resume Friday.   Increase activity slowly   Complete by: As directed         Discharge Medications   Allergies as of 01/22/2022       Reactions   Ciprofloxacin Itching, Rash        Medication List     TAKE these medications    aspirin EC 81 MG tablet Take 1 tablet (81 mg total) by mouth daily. Swallow whole.   clopidogrel 75 MG tablet Commonly known as: PLAVIX Take 1 tablet (75 mg total) by mouth daily with breakfast. Start taking on: Jan 23, 2022   glipiZIDE 10 MG tablet Commonly known as: GLUCOTROL Take 10 mg by mouth 2 (two) times daily before a meal.   metFORMIN 500 MG 24 hr tablet Commonly known as: GLUCOPHAGE-XR Take 1,000 mg by mouth 2 (two) times daily.   nitroGLYCERIN 0.4 MG SL tablet Commonly known as: Nitrostat Place 1 tablet (0.4 mg total) under the tongue every  5 (five) minutes as needed.   Ozempic (0.25 or 0.5 MG/DOSE) 2 MG/1.5ML Sopn Generic drug: Semaglutide(0.25 or 0.5MG /DOS) Inject 0.5 mg into the skin every Friday.   pantoprazole 40 MG tablet Commonly known as: PROTONIX Take 40 mg by mouth 2 (two) times daily.   pioglitazone 30 MG tablet Commonly known as: ACTOS Take 30 mg by mouth daily.   rosuvastatin 40 MG tablet Commonly known as: CRESTOR Take 1 tablet (40 mg total) by mouth daily. What changed:  medication strength how much to take   True Metrix Blood Glucose Test test strip Generic drug: glucose blood daily.   True Metrix Meter w/Device Kit See admin instructions.           Allergies Allergies  Allergen Reactions   Ciprofloxacin Itching and Rash    Outstanding Labs/Studies   Lipid panel and LFTS in 8 weeks.   Duration of Discharge Encounter   Greater than 30 minutes including physician time.  Jarrett Soho, PA 01/22/2022, 3:00 PM

## 2022-01-23 ENCOUNTER — Encounter (HOSPITAL_COMMUNITY): Payer: Self-pay | Admitting: Cardiology

## 2022-01-23 LAB — POCT ACTIVATED CLOTTING TIME: Activated Clotting Time: 311 seconds

## 2022-01-23 MED FILL — Verapamil HCl IV Soln 2.5 MG/ML: INTRAVENOUS | Qty: 2 | Status: AC

## 2022-01-23 MED FILL — Lidocaine HCl Local Preservative Free (PF) Inj 1%: INTRAMUSCULAR | Qty: 30 | Status: AC

## 2022-01-25 ENCOUNTER — Ambulatory Visit: Payer: Medicare HMO | Admitting: Cardiology

## 2022-02-01 ENCOUNTER — Telehealth (HOSPITAL_COMMUNITY): Payer: Self-pay

## 2022-02-01 NOTE — Telephone Encounter (Signed)
Outside referral faxed to [facility]. 

## 2022-02-02 NOTE — Progress Notes (Signed)
Cardiology Office Note   Date:  02/06/2022   ID:  Ivan Dickson, DOB [birth year], MRN [id number]  PCP:  Ivan Northern, Dickson  Cardiologist:   Ivan Galdamez [place], Dickson   Chief Complaint  Patient presents with   Follow-up    Post cath.   Coronary Artery Disease      History of Present Illness: Ivan Dickson is a 71 y.o. male who is seen at the request of Ivan Dickson for evaluation of HTN and family history of CAD. Has history of HLD and DM type 2.   He reports over past year that he has symptoms of lightheadedness and dizziness when out working in the yard. Worse after squatting or bending over. Noted BP as low as 60/40. Was on lisinopril for renal protection but this has been discontinued. No real change in symptoms. When this happens he stops and rests. No true syncope.  Does complain of exertional pain in neck and shoulders. Did see a cardiologist a couple of years ago in [place]. Apparently had stress test, Echo, and Zio patch monitor then. Is very concerned about CV risk given risk factors of DM, HLD, and strong family history of CAD.   Subsequent coronary CTA indicated significant obstructive disease. He underwent cardiac cath demonstrating severe disease in the mid RCA that was successfully stented with DES. There was moderate disease in the diagonal branches but the LAD appeared nonobstructive (abnormal FFR on CT). He was treated with ASA and Plavix. Crestor increased to 40 mg. Lisinopril discontinued due to dizziness. His renal parameters improved off ACEi.  On follow up today he feels great. Still gets a little lightheaded when working outside. No chest pain or dyspnea. No edema. Some bruising on Plavix.   Past Medical History:  Diagnosis Date   Diabetes mellitus without complication (HCC)    GERD (gastroesophageal reflux disease)    High cholesterol    History of colon polyps     Past Surgical History:  Procedure Laterality Date   APPENDECTOMY     COLONOSCOPY     found  polyps over 10 years ago   CORONARY STENT INTERVENTION N/A 01/22/2022   Procedure: CORONARY STENT INTERVENTION;  Surgeon: Dickson, Ivan Dickson;  Location: [facility] INVASIVE CV LAB;  Service: Cardiovascular;  Laterality: N/A;   ESOPHAGOGASTRODUODENOSCOPY     over 10 years ago thinks he did   HAND SURGERY Right    LEFT HEART CATH AND CORONARY ANGIOGRAPHY N/A 01/22/2022   Procedure: LEFT HEART CATH AND CORONARY ANGIOGRAPHY;  Surgeon: Dickson, Ivan Jelinek M, Dickson;  Location: [facility] INVASIVE CV LAB;  Service: Cardiovascular;  Laterality: N/A;     Current Outpatient Medications  Medication Sig Dispense Refill   aspirin EC 81 MG tablet Take 1 tablet (81 mg total) by mouth daily. Swallow whole. 90 tablet 3   Blood Glucose Monitoring Suppl (TRUE METRIX METER) w/Device KIT See admin instructions.     clopidogrel (PLAVIX) 75 MG tablet Take 1 tablet (75 mg total) by mouth daily with breakfast. 90 tablet 1   glipiZIDE (GLUCOTROL) 10 MG tablet Take 10 mg by mouth 2 (two) times daily before a meal.     metFORMIN (GLUCOPHAGE-XR) 500 MG 24 hr tablet Take 1,000 mg by mouth 2 (two) times daily.     nitroGLYCERIN (NITROSTAT) 0.4 MG SL tablet Place 1 tablet (0.4 mg total) under the tongue every 5 (five) minutes as needed. 25 tablet 12   OZEMPIC, 0.25 OR 0.5 MG/DOSE, 2 MG/1.5ML SOPN Inject 0.5 mg into  the skin every Friday.     pantoprazole (PROTONIX) 40 MG tablet Take 40 mg by mouth 2 (two) times daily.     pioglitazone (ACTOS) 30 MG tablet Take 30 mg by mouth daily.     rosuvastatin (CRESTOR) 40 MG tablet Take 1 tablet (40 mg total) by mouth daily. 30 tablet 6   TRUE METRIX BLOOD GLUCOSE TEST test strip daily.     No current facility-administered medications for this visit.    Allergies:   Ciprofloxacin    Social History:  The patient  reports that he has never smoked. He has never used smokeless tobacco. He reports current alcohol use. He reports that he does not currently use drugs.   Family History:  The patient's family  history includes Arrhythmia in his sister; Breast cancer in his sister; Diabetes in his mother; Heart attack in his brother; Heart disease in his brother and mother; Heart failure in his son; Hyperlipidemia in his mother; Hypertension in his mother; Kidney cancer in his sister; Stroke in his father.    ROS:  Please see the history of present illness.   Otherwise, review of systems are positive for none.   All other systems are reviewed and negative.    PHYSICAL EXAM: VS:  BP 140/66 (BP Location: Right Arm, Patient Position: Sitting, Cuff Size: Normal)   Pulse 69   Ht 6' (1.829 m)   Wt 197 lb (89.4 kg)   BMI 26.72 kg/m  , BMI Body mass index is 26.72 kg/m. Gen: Well nourished, well developed, in no acute distress HEENT: normal Neck: no JVD, carotid bruits, or masses Cardiac: RRR; no murmurs, rubs, or gallops,no edema. No radial site hematoma Respiratory:  clear to auscultation bilaterally, normal work of breathing GI: soft, nontender, nondistended, + BS MS: no deformity or atrophy Skin: warm and dry, no rash Neuro:  Strength and sensation are intact Psych: euthymic mood, full affect   EKG:  EKG is ordered today. The ekg ordered today demonstrates NSR rate 69. Normal. I have personally reviewed and interpreted this study.    Recent Dickson: 11/13/2021: ALT 19; TSH 3.04 01/15/2022: BUN 24; Creatinine, Ser 1.23; Hemoglobin 12.2; Platelets 212; Potassium 5.1; Sodium 140    Lipid Panel    Component Value Date/Time   CHOL 135 12/26/2021 0941   TRIG 111 12/26/2021 0941   HDL 45 12/26/2021 0941   CHOLHDL 3.0 12/26/2021 0941   LDLCALC 70 12/26/2021 0941      Wt Readings from Last 3 Encounters:  02/06/22 197 lb (89.4 kg)  01/22/22 200 lb (90.7 kg)  12/26/21 204 lb (92.5 kg)      Other studies Reviewed: Additional studies/ records that were reviewed today include:   ADDENDUM REPORT: 01/09/2022 15:20   CLINICAL DATA:  47M with exertional shoulder pain    EXAM: Cardiac/Coronary CTA   TECHNIQUE: The patient was scanned on a [employer].   FINDINGS: A 100 kV prospective scan was triggered in the descending thoracic aorta at 111 HU's. Axial non-contrast 3 mm slices were carried out through the heart. The data set was analyzed on a dedicated work station and scored using the Agatson method. Gantry rotation speed was 250 msecs and collimation was .6 mm. 0.8 mg of sl NTG was given. The 3D data set was reconstructed in 5% intervals of the 35-75% of the R-R cycle. Phases were analyzed on a dedicated work station using MPR, MIP and VRT modes. The patient received 80 cc of contrast.  Coronary Arteries:  Normal coronary origin.  Right dominance.   RCA is a large dominant artery that gives rise to PDA and PLA. Calcified plaque in the proximal RCA causes 50-69% stenosis. Mixed plaque in the mid RCA causes 70-99% stenosis. Calcified plaque in the distal RCA causes 25-49% stenosis.   Left main is a large artery that gives rise to LAD and LCX arteries.   LAD is a large vessel. Mixed plaque in the proximal LAD causes 50-69% stenosis.   LCX is a non-dominant artery that gives rise to one large OM1 branch.   Other findings:   Left Ventricle: Normal size   Left Atrium: Normal size. PFO   Pulmonary Veins: Normal configuration   Right Ventricle: Mild dilatation   Right Atrium: Normal size   Cardiac valves: Mild AV calcifications   Thoracic aorta: Normal size   Pulmonary Arteries: Normal size   Systemic Veins: Normal drainage   Pericardium: Normal thickness   IMPRESSION: 1. Coronary calcium score of 1129. This was 88th percentile for age and sex matched control.   2.  Normal coronary origin with right dominance.   3.  Obstructive CAD   4.  Mixed plaque in mid RCA causes severe (70-99%) stenosis   5. Mixed plaque in the proximal LAD causes moderate (50-69%) stenosis.   6.  Will send for CTFFR   CAD-RADS 4  Severe stenosis. (70-99% or > 50% left main). Cardiac catheterization or CT FFR is recommended. Consider symptom-guided anti-ischemic pharmacotherapy as well as risk factor modification per guideline directed care.     Electronically Signed   By: Oswaldo Milian M.D.   On: 01/09/2022 15:20    Addended by Donato Heinz, Dickson on 01/09/2022  3:23 PM   Study Result  Narrative & Impression  EXAM: OVER-READ INTERPRETATION  CT CHEST   The following report is an over-read performed by radiologist Dr. Dahlia Bailiff of [facility], [place] on 01/09/2022. This over-read does not include interpretation of cardiac or coronary anatomy or pathology. The coronary calcium score/coronary CTA interpretation by the cardiologist is attached.   COMPARISON:  None Available.   FINDINGS: Vascular: Aortic atherosclerosis. No acute noncardiac vascular finding.   Mediastinum/Nodes: No pathologically enlarged mediastinal or hilar lymph nodes within the visualized portions of the thorax. Distal esophagus is grossly unremarkable.   Lungs/Pleura: Right lower lobe calcified granuloma. Within the visualized portions of the thorax there are no suspicious pulmonary nodules or masses and no pleural effusion or pneumothorax. Hypoventilatory change in the dependent lungs.   Upper Abdomen: No acute abnormality.   Musculoskeletal: Thoracic spondylosis.   IMPRESSION: No acute noncardiac finding.   Aortic Atherosclerosis (ICD10-I70.0).   Electronically Signed: By: Dahlia Bailiff M.D. On: 01/09/2022 11:49    FFRCT ANALYSIS   FINDINGS: FFRct analysis was performed on the original cardiac CT angiogram dataset. Diagrammatic representation of the FFRct analysis is provided in a separate PDF document in PACS. This dictation was created using the PDF document and an interactive 3D model of the results. 3D model is not available in the EMR/PACS. Normal FFR range is >0.80.   1. Left Main:  No significant stenosis   2. LAD: CTFFR 0.76 across lesion in proximal LAD suggesting lesion is hemodynamically significant   3. LCX: No significant stenosis   4. RCA: CTFFR 0.68 across lesion in mid RCA, suggesting lesion is hemodynamically significant   IMPRESSION: 1.  CT FFR suggests obstructive CAD in proximal LAD and mid RCA   2.  Cardiac  catheterization recommended     Electronically Signed   By: Oswaldo Milian M.D.   On: 01/10/2022 00:42   Cardiac cath/PCI 01/22/22: Procedures  CORONARY STENT INTERVENTION  LEFT HEART CATH AND CORONARY ANGIOGRAPHY   Conclusion      Prox LAD to Mid LAD lesion is 50% stenosed.   1st Diag lesion is 70% stenosed.   2nd Diag lesion is 70% stenosed.   Prox RCA to Mid RCA lesion is 60% stenosed.   Mid RCA lesion is 90% stenosed.   A drug-eluting stent was successfully placed using a SYNERGY XD [id number].   Post intervention, there is a 0% residual stenosis.   Post intervention, there is a 0% residual stenosis.   The left ventricular systolic function is normal.   LV end diastolic pressure is normal.   The left ventricular ejection fraction is 55-65% by visual estimate.   There is no mitral valve regurgitation.   Single vessel obstructive CAD Normal LV function Normal LVEDP Successful PCI of the mid RCA with DES x 1   Plan: the proximal LAD does not appear obstructive on Cath ( FFR abnormal on CTA). Will treat medically unless he has significant angina. Continue DAPT for at least 6 months. Anticipate same day DC.   Coronary Diagrams  Diagnostic Dominance: Right Intervention  Implants    ASSESSMENT AND PLAN:  1.  CAD s/p DES of the mid RCA. On ASA/plavix for 6 months. LAD did not appear to be obstructive on cath. He is asymptomatic.   2. Lightheadedness and dizziness. Somewhat positional. Has been associated with low BP. Suspect he has some degree of autonomic dysfunction related to his DM. On no antihypertensives now.   Liberalize sodium in diet. Focus on maintaining good hydration, support hose. Sleep with some incline.  3. HLD. LDL 70. Now on high dose Crestor.   4. Renal insufficieny. Creatinine 1.41. improved to 1.23 off ACEi   Current medicines are reviewed at length with the patient today.  The patient does not have concerns regarding medicines.  The following changes have been made:  no change  Dickson/ tests ordered today include:   Orders Placed This Encounter  Procedures   EKG 12-Lead         Disposition:   FU with me in 4 months  Signed, Fadia Marlar [place], Dickson  02/06/2022 9:33 AM    [facility] [address], [place], [place], [address] Phone [phone], Fax [phone]

## 2022-02-06 ENCOUNTER — Encounter: Payer: Self-pay | Admitting: Cardiology

## 2022-02-06 ENCOUNTER — Ambulatory Visit (INDEPENDENT_AMBULATORY_CARE_PROVIDER_SITE_OTHER): Payer: Medicare HMO | Admitting: Cardiology

## 2022-02-06 VITALS — BP 140/66 | HR 69 | Ht 72.0 in | Wt 197.0 lb

## 2022-02-06 DIAGNOSIS — Z955 Presence of coronary angioplasty implant and graft: Secondary | ICD-10-CM

## 2022-02-06 DIAGNOSIS — I209 Angina pectoris, unspecified: Secondary | ICD-10-CM | POA: Diagnosis not present

## 2022-02-06 DIAGNOSIS — I2510 Atherosclerotic heart disease of native coronary artery without angina pectoris: Secondary | ICD-10-CM

## 2022-02-06 DIAGNOSIS — E119 Type 2 diabetes mellitus without complications: Secondary | ICD-10-CM

## 2022-02-06 DIAGNOSIS — N289 Disorder of kidney and ureter, unspecified: Secondary | ICD-10-CM

## 2022-02-06 DIAGNOSIS — E785 Hyperlipidemia, unspecified: Secondary | ICD-10-CM | POA: Diagnosis not present

## 2022-02-06 DIAGNOSIS — R42 Dizziness and giddiness: Secondary | ICD-10-CM

## 2022-02-06 NOTE — Patient Instructions (Signed)
Medication Instructions:  Your physician recommends that you continue on your current medications as directed. Please refer to the Current Medication list given to you today.  *If you need a refill on your cardiac medications before your next appointment, please call your pharmacy*   Follow-Up: At [facility], you and your health needs are our priority.  As part of our continuing mission to provide you with exceptional heart care, we have created designated Provider Care Teams.  These Care Teams include your primary Cardiologist (physician) and Advanced Practice Providers (APPs -  Physician Assistants and Nurse Practitioners) who all work together to provide you with the care you need, when you need it.  We recommend signing up for the patient portal called "MyChart".  Sign up information is provided on this After Visit Summary.  MyChart is used to connect with patients for Virtual Visits (Telemedicine).  Patients are able to view lab/test results, encounter notes, upcoming appointments, etc.  Non-urgent messages can be sent to your provider as well.   To learn more about what you can do with MyChart, go to [email or web address].    Your next appointment:   4 month(s)  The format for your next appointment:   In Person  Provider:   Peter Jordan, MD 

## 2022-02-19 ENCOUNTER — Telehealth: Payer: Self-pay | Admitting: Cardiology

## 2022-02-19 DIAGNOSIS — Z955 Presence of coronary angioplasty implant and graft: Secondary | ICD-10-CM | POA: Diagnosis not present

## 2022-02-19 DIAGNOSIS — E119 Type 2 diabetes mellitus without complications: Secondary | ICD-10-CM | POA: Diagnosis not present

## 2022-02-19 DIAGNOSIS — E785 Hyperlipidemia, unspecified: Secondary | ICD-10-CM | POA: Diagnosis not present

## 2022-02-19 DIAGNOSIS — I10 Essential (primary) hypertension: Secondary | ICD-10-CM | POA: Diagnosis not present

## 2022-02-19 DIAGNOSIS — I951 Orthostatic hypotension: Secondary | ICD-10-CM | POA: Diagnosis not present

## 2022-02-19 NOTE — Telephone Encounter (Signed)
Cardiac rehab calling to get approve for patient to participate in the rehab. Please advise    Fax: [phone]

## 2022-02-19 NOTE — Telephone Encounter (Signed)
Returned call to Raynelle Fanning at [facility]-they are requesting a note stating it is ok for patient to participate in cardiac rehab with Dr. Elvis Coil signature.   She also reports patient is orthostatic but patient is asymptomatic (patient reported Dr. Swaziland is aware).   She will fax over vitals for Dr. [place] to review as well.   Advised Dr. [place] is ooo this week but will review and complete letter on his return.    Per office note 6/7:    2. Lightheadedness and dizziness. Somewhat positional. Has been associated with low BP. Suspect he has some degree of autonomic dysfunction related to his DM. On no antihypertensives now.  Liberalize sodium in diet. Focus on maintaining good hydration, support hose. Sleep with some incline.

## 2022-02-19 NOTE — Telephone Encounter (Signed)
This is a Dr. Sw[place]iland patient but they are requesting to speak to Dr. Dulce Sellar since [place] is out. They stated that Dr. Dulce Sellar is the director at the rehab. They didn't want to speak to the dod at [facility]. They requested a call back from Dr. Dulce Sellar

## 2022-02-20 NOTE — Telephone Encounter (Signed)
He is OK to start Rehab. I will be back Monday if a signature is required.  Deanie Jupiter [place] MD, [facility]

## 2022-02-25 DIAGNOSIS — I10 Essential (primary) hypertension: Secondary | ICD-10-CM | POA: Diagnosis not present

## 2022-02-25 DIAGNOSIS — Z955 Presence of coronary angioplasty implant and graft: Secondary | ICD-10-CM | POA: Diagnosis not present

## 2022-02-25 DIAGNOSIS — I951 Orthostatic hypotension: Secondary | ICD-10-CM | POA: Diagnosis not present

## 2022-02-25 DIAGNOSIS — E119 Type 2 diabetes mellitus without complications: Secondary | ICD-10-CM | POA: Diagnosis not present

## 2022-02-25 DIAGNOSIS — E785 Hyperlipidemia, unspecified: Secondary | ICD-10-CM | POA: Diagnosis not present

## 2022-02-27 DIAGNOSIS — Z955 Presence of coronary angioplasty implant and graft: Secondary | ICD-10-CM | POA: Diagnosis not present

## 2022-02-27 DIAGNOSIS — I10 Essential (primary) hypertension: Secondary | ICD-10-CM | POA: Diagnosis not present

## 2022-02-27 DIAGNOSIS — I951 Orthostatic hypotension: Secondary | ICD-10-CM | POA: Diagnosis not present

## 2022-02-27 DIAGNOSIS — E785 Hyperlipidemia, unspecified: Secondary | ICD-10-CM | POA: Diagnosis not present

## 2022-02-27 DIAGNOSIS — E119 Type 2 diabetes mellitus without complications: Secondary | ICD-10-CM | POA: Diagnosis not present

## 2022-03-01 DIAGNOSIS — I951 Orthostatic hypotension: Secondary | ICD-10-CM | POA: Diagnosis not present

## 2022-03-01 DIAGNOSIS — Z955 Presence of coronary angioplasty implant and graft: Secondary | ICD-10-CM | POA: Diagnosis not present

## 2022-03-01 DIAGNOSIS — I10 Essential (primary) hypertension: Secondary | ICD-10-CM | POA: Diagnosis not present

## 2022-03-01 DIAGNOSIS — E119 Type 2 diabetes mellitus without complications: Secondary | ICD-10-CM | POA: Diagnosis not present

## 2022-03-01 DIAGNOSIS — E785 Hyperlipidemia, unspecified: Secondary | ICD-10-CM | POA: Diagnosis not present

## 2022-03-04 DIAGNOSIS — I10 Essential (primary) hypertension: Secondary | ICD-10-CM | POA: Diagnosis not present

## 2022-03-04 DIAGNOSIS — I951 Orthostatic hypotension: Secondary | ICD-10-CM | POA: Diagnosis not present

## 2022-03-04 DIAGNOSIS — E875 Hyperkalemia: Secondary | ICD-10-CM | POA: Diagnosis not present

## 2022-03-04 DIAGNOSIS — Z955 Presence of coronary angioplasty implant and graft: Secondary | ICD-10-CM | POA: Diagnosis not present

## 2022-03-04 DIAGNOSIS — E119 Type 2 diabetes mellitus without complications: Secondary | ICD-10-CM | POA: Diagnosis not present

## 2022-03-06 DIAGNOSIS — E119 Type 2 diabetes mellitus without complications: Secondary | ICD-10-CM | POA: Diagnosis not present

## 2022-03-06 DIAGNOSIS — E875 Hyperkalemia: Secondary | ICD-10-CM | POA: Diagnosis not present

## 2022-03-06 DIAGNOSIS — Z955 Presence of coronary angioplasty implant and graft: Secondary | ICD-10-CM | POA: Diagnosis not present

## 2022-03-06 DIAGNOSIS — I10 Essential (primary) hypertension: Secondary | ICD-10-CM | POA: Diagnosis not present

## 2022-03-06 DIAGNOSIS — I951 Orthostatic hypotension: Secondary | ICD-10-CM | POA: Diagnosis not present

## 2022-03-08 DIAGNOSIS — I951 Orthostatic hypotension: Secondary | ICD-10-CM | POA: Diagnosis not present

## 2022-03-08 DIAGNOSIS — E875 Hyperkalemia: Secondary | ICD-10-CM | POA: Diagnosis not present

## 2022-03-08 DIAGNOSIS — Z955 Presence of coronary angioplasty implant and graft: Secondary | ICD-10-CM | POA: Diagnosis not present

## 2022-03-08 DIAGNOSIS — I10 Essential (primary) hypertension: Secondary | ICD-10-CM | POA: Diagnosis not present

## 2022-03-08 DIAGNOSIS — E119 Type 2 diabetes mellitus without complications: Secondary | ICD-10-CM | POA: Diagnosis not present

## 2022-03-11 DIAGNOSIS — Z0000 Encounter for general adult medical examination without abnormal findings: Secondary | ICD-10-CM | POA: Diagnosis not present

## 2022-03-11 DIAGNOSIS — E1140 Type 2 diabetes mellitus with diabetic neuropathy, unspecified: Secondary | ICD-10-CM | POA: Diagnosis not present

## 2022-03-11 DIAGNOSIS — Z6826 Body mass index (BMI) 26.0-26.9, adult: Secondary | ICD-10-CM | POA: Diagnosis not present

## 2022-03-12 NOTE — Telephone Encounter (Signed)
Spoke to patient he stated he has already restarted cardiac rehab.

## 2022-03-13 DIAGNOSIS — E119 Type 2 diabetes mellitus without complications: Secondary | ICD-10-CM | POA: Diagnosis not present

## 2022-03-13 DIAGNOSIS — E875 Hyperkalemia: Secondary | ICD-10-CM | POA: Diagnosis not present

## 2022-03-13 DIAGNOSIS — I10 Essential (primary) hypertension: Secondary | ICD-10-CM | POA: Diagnosis not present

## 2022-03-13 DIAGNOSIS — I951 Orthostatic hypotension: Secondary | ICD-10-CM | POA: Diagnosis not present

## 2022-03-13 DIAGNOSIS — Z955 Presence of coronary angioplasty implant and graft: Secondary | ICD-10-CM | POA: Diagnosis not present

## 2022-03-15 DIAGNOSIS — I951 Orthostatic hypotension: Secondary | ICD-10-CM | POA: Diagnosis not present

## 2022-03-15 DIAGNOSIS — I10 Essential (primary) hypertension: Secondary | ICD-10-CM | POA: Diagnosis not present

## 2022-03-15 DIAGNOSIS — Z955 Presence of coronary angioplasty implant and graft: Secondary | ICD-10-CM | POA: Diagnosis not present

## 2022-03-15 DIAGNOSIS — E875 Hyperkalemia: Secondary | ICD-10-CM | POA: Diagnosis not present

## 2022-03-15 DIAGNOSIS — E119 Type 2 diabetes mellitus without complications: Secondary | ICD-10-CM | POA: Diagnosis not present

## 2022-03-18 DIAGNOSIS — I951 Orthostatic hypotension: Secondary | ICD-10-CM | POA: Diagnosis not present

## 2022-03-18 DIAGNOSIS — E875 Hyperkalemia: Secondary | ICD-10-CM | POA: Diagnosis not present

## 2022-03-18 DIAGNOSIS — I10 Essential (primary) hypertension: Secondary | ICD-10-CM | POA: Diagnosis not present

## 2022-03-18 DIAGNOSIS — Z955 Presence of coronary angioplasty implant and graft: Secondary | ICD-10-CM | POA: Diagnosis not present

## 2022-03-18 DIAGNOSIS — E119 Type 2 diabetes mellitus without complications: Secondary | ICD-10-CM | POA: Diagnosis not present

## 2022-03-20 DIAGNOSIS — I951 Orthostatic hypotension: Secondary | ICD-10-CM | POA: Diagnosis not present

## 2022-03-20 DIAGNOSIS — E119 Type 2 diabetes mellitus without complications: Secondary | ICD-10-CM | POA: Diagnosis not present

## 2022-03-20 DIAGNOSIS — Z955 Presence of coronary angioplasty implant and graft: Secondary | ICD-10-CM | POA: Diagnosis not present

## 2022-03-20 DIAGNOSIS — I10 Essential (primary) hypertension: Secondary | ICD-10-CM | POA: Diagnosis not present

## 2022-03-20 DIAGNOSIS — E875 Hyperkalemia: Secondary | ICD-10-CM | POA: Diagnosis not present

## 2022-03-22 DIAGNOSIS — Z955 Presence of coronary angioplasty implant and graft: Secondary | ICD-10-CM | POA: Diagnosis not present

## 2022-03-22 DIAGNOSIS — I951 Orthostatic hypotension: Secondary | ICD-10-CM | POA: Diagnosis not present

## 2022-03-22 DIAGNOSIS — E875 Hyperkalemia: Secondary | ICD-10-CM | POA: Diagnosis not present

## 2022-03-22 DIAGNOSIS — E119 Type 2 diabetes mellitus without complications: Secondary | ICD-10-CM | POA: Diagnosis not present

## 2022-03-22 DIAGNOSIS — I10 Essential (primary) hypertension: Secondary | ICD-10-CM | POA: Diagnosis not present

## 2022-03-25 DIAGNOSIS — I10 Essential (primary) hypertension: Secondary | ICD-10-CM | POA: Diagnosis not present

## 2022-03-25 DIAGNOSIS — I951 Orthostatic hypotension: Secondary | ICD-10-CM | POA: Diagnosis not present

## 2022-03-25 DIAGNOSIS — E119 Type 2 diabetes mellitus without complications: Secondary | ICD-10-CM | POA: Diagnosis not present

## 2022-03-25 DIAGNOSIS — Z955 Presence of coronary angioplasty implant and graft: Secondary | ICD-10-CM | POA: Diagnosis not present

## 2022-03-25 DIAGNOSIS — E875 Hyperkalemia: Secondary | ICD-10-CM | POA: Diagnosis not present

## 2022-03-27 DIAGNOSIS — Z955 Presence of coronary angioplasty implant and graft: Secondary | ICD-10-CM | POA: Diagnosis not present

## 2022-03-27 DIAGNOSIS — I10 Essential (primary) hypertension: Secondary | ICD-10-CM | POA: Diagnosis not present

## 2022-03-27 DIAGNOSIS — I951 Orthostatic hypotension: Secondary | ICD-10-CM | POA: Diagnosis not present

## 2022-03-27 DIAGNOSIS — E875 Hyperkalemia: Secondary | ICD-10-CM | POA: Diagnosis not present

## 2022-03-27 DIAGNOSIS — E119 Type 2 diabetes mellitus without complications: Secondary | ICD-10-CM | POA: Diagnosis not present

## 2022-03-29 DIAGNOSIS — E875 Hyperkalemia: Secondary | ICD-10-CM | POA: Diagnosis not present

## 2022-03-29 DIAGNOSIS — I10 Essential (primary) hypertension: Secondary | ICD-10-CM | POA: Diagnosis not present

## 2022-03-29 DIAGNOSIS — Z955 Presence of coronary angioplasty implant and graft: Secondary | ICD-10-CM | POA: Diagnosis not present

## 2022-03-29 DIAGNOSIS — E119 Type 2 diabetes mellitus without complications: Secondary | ICD-10-CM | POA: Diagnosis not present

## 2022-03-29 DIAGNOSIS — I951 Orthostatic hypotension: Secondary | ICD-10-CM | POA: Diagnosis not present

## 2022-04-01 ENCOUNTER — Other Ambulatory Visit (HOSPITAL_COMMUNITY): Payer: Self-pay

## 2022-04-01 DIAGNOSIS — E119 Type 2 diabetes mellitus without complications: Secondary | ICD-10-CM | POA: Diagnosis not present

## 2022-04-01 DIAGNOSIS — I10 Essential (primary) hypertension: Secondary | ICD-10-CM | POA: Diagnosis not present

## 2022-04-01 DIAGNOSIS — I951 Orthostatic hypotension: Secondary | ICD-10-CM | POA: Diagnosis not present

## 2022-04-01 DIAGNOSIS — E875 Hyperkalemia: Secondary | ICD-10-CM | POA: Diagnosis not present

## 2022-04-01 DIAGNOSIS — Z955 Presence of coronary angioplasty implant and graft: Secondary | ICD-10-CM | POA: Diagnosis not present

## 2022-04-02 ENCOUNTER — Other Ambulatory Visit (HOSPITAL_COMMUNITY): Payer: Self-pay

## 2022-04-08 DIAGNOSIS — I951 Orthostatic hypotension: Secondary | ICD-10-CM | POA: Diagnosis not present

## 2022-04-08 DIAGNOSIS — I10 Essential (primary) hypertension: Secondary | ICD-10-CM | POA: Diagnosis not present

## 2022-04-08 DIAGNOSIS — Z955 Presence of coronary angioplasty implant and graft: Secondary | ICD-10-CM | POA: Diagnosis not present

## 2022-04-08 DIAGNOSIS — E119 Type 2 diabetes mellitus without complications: Secondary | ICD-10-CM | POA: Diagnosis not present

## 2022-04-08 DIAGNOSIS — E785 Hyperlipidemia, unspecified: Secondary | ICD-10-CM | POA: Diagnosis not present

## 2022-04-10 DIAGNOSIS — E119 Type 2 diabetes mellitus without complications: Secondary | ICD-10-CM | POA: Diagnosis not present

## 2022-04-10 DIAGNOSIS — E785 Hyperlipidemia, unspecified: Secondary | ICD-10-CM | POA: Diagnosis not present

## 2022-04-10 DIAGNOSIS — Z955 Presence of coronary angioplasty implant and graft: Secondary | ICD-10-CM | POA: Diagnosis not present

## 2022-04-10 DIAGNOSIS — I10 Essential (primary) hypertension: Secondary | ICD-10-CM | POA: Diagnosis not present

## 2022-04-10 DIAGNOSIS — I951 Orthostatic hypotension: Secondary | ICD-10-CM | POA: Diagnosis not present

## 2022-04-12 DIAGNOSIS — E119 Type 2 diabetes mellitus without complications: Secondary | ICD-10-CM | POA: Diagnosis not present

## 2022-04-12 DIAGNOSIS — Z955 Presence of coronary angioplasty implant and graft: Secondary | ICD-10-CM | POA: Diagnosis not present

## 2022-04-12 DIAGNOSIS — E785 Hyperlipidemia, unspecified: Secondary | ICD-10-CM | POA: Diagnosis not present

## 2022-04-12 DIAGNOSIS — I10 Essential (primary) hypertension: Secondary | ICD-10-CM | POA: Diagnosis not present

## 2022-04-12 DIAGNOSIS — I951 Orthostatic hypotension: Secondary | ICD-10-CM | POA: Diagnosis not present

## 2022-04-15 DIAGNOSIS — E119 Type 2 diabetes mellitus without complications: Secondary | ICD-10-CM | POA: Diagnosis not present

## 2022-04-15 DIAGNOSIS — I10 Essential (primary) hypertension: Secondary | ICD-10-CM | POA: Diagnosis not present

## 2022-04-15 DIAGNOSIS — Z955 Presence of coronary angioplasty implant and graft: Secondary | ICD-10-CM | POA: Diagnosis not present

## 2022-04-15 DIAGNOSIS — I951 Orthostatic hypotension: Secondary | ICD-10-CM | POA: Diagnosis not present

## 2022-04-15 DIAGNOSIS — E785 Hyperlipidemia, unspecified: Secondary | ICD-10-CM | POA: Diagnosis not present

## 2022-04-17 DIAGNOSIS — I951 Orthostatic hypotension: Secondary | ICD-10-CM | POA: Diagnosis not present

## 2022-04-17 DIAGNOSIS — E119 Type 2 diabetes mellitus without complications: Secondary | ICD-10-CM | POA: Diagnosis not present

## 2022-04-17 DIAGNOSIS — Z955 Presence of coronary angioplasty implant and graft: Secondary | ICD-10-CM | POA: Diagnosis not present

## 2022-04-17 DIAGNOSIS — E785 Hyperlipidemia, unspecified: Secondary | ICD-10-CM | POA: Diagnosis not present

## 2022-04-17 DIAGNOSIS — I10 Essential (primary) hypertension: Secondary | ICD-10-CM | POA: Diagnosis not present

## 2022-04-19 DIAGNOSIS — I951 Orthostatic hypotension: Secondary | ICD-10-CM | POA: Diagnosis not present

## 2022-04-19 DIAGNOSIS — Z955 Presence of coronary angioplasty implant and graft: Secondary | ICD-10-CM | POA: Diagnosis not present

## 2022-04-19 DIAGNOSIS — E785 Hyperlipidemia, unspecified: Secondary | ICD-10-CM | POA: Diagnosis not present

## 2022-04-19 DIAGNOSIS — E119 Type 2 diabetes mellitus without complications: Secondary | ICD-10-CM | POA: Diagnosis not present

## 2022-04-19 DIAGNOSIS — I10 Essential (primary) hypertension: Secondary | ICD-10-CM | POA: Diagnosis not present

## 2022-04-24 DIAGNOSIS — I10 Essential (primary) hypertension: Secondary | ICD-10-CM | POA: Diagnosis not present

## 2022-04-24 DIAGNOSIS — Z955 Presence of coronary angioplasty implant and graft: Secondary | ICD-10-CM | POA: Diagnosis not present

## 2022-04-24 DIAGNOSIS — E119 Type 2 diabetes mellitus without complications: Secondary | ICD-10-CM | POA: Diagnosis not present

## 2022-04-24 DIAGNOSIS — I951 Orthostatic hypotension: Secondary | ICD-10-CM | POA: Diagnosis not present

## 2022-04-24 DIAGNOSIS — E785 Hyperlipidemia, unspecified: Secondary | ICD-10-CM | POA: Diagnosis not present

## 2022-04-26 ENCOUNTER — Other Ambulatory Visit (HOSPITAL_COMMUNITY): Payer: Self-pay

## 2022-04-26 DIAGNOSIS — E785 Hyperlipidemia, unspecified: Secondary | ICD-10-CM | POA: Diagnosis not present

## 2022-04-26 DIAGNOSIS — I10 Essential (primary) hypertension: Secondary | ICD-10-CM | POA: Diagnosis not present

## 2022-04-26 DIAGNOSIS — E119 Type 2 diabetes mellitus without complications: Secondary | ICD-10-CM | POA: Diagnosis not present

## 2022-04-26 DIAGNOSIS — Z955 Presence of coronary angioplasty implant and graft: Secondary | ICD-10-CM | POA: Diagnosis not present

## 2022-04-26 DIAGNOSIS — I951 Orthostatic hypotension: Secondary | ICD-10-CM | POA: Diagnosis not present

## 2022-04-29 DIAGNOSIS — Z955 Presence of coronary angioplasty implant and graft: Secondary | ICD-10-CM | POA: Diagnosis not present

## 2022-04-29 DIAGNOSIS — I951 Orthostatic hypotension: Secondary | ICD-10-CM | POA: Diagnosis not present

## 2022-04-29 DIAGNOSIS — E785 Hyperlipidemia, unspecified: Secondary | ICD-10-CM | POA: Diagnosis not present

## 2022-04-29 DIAGNOSIS — E119 Type 2 diabetes mellitus without complications: Secondary | ICD-10-CM | POA: Diagnosis not present

## 2022-04-29 DIAGNOSIS — I10 Essential (primary) hypertension: Secondary | ICD-10-CM | POA: Diagnosis not present

## 2022-05-03 DIAGNOSIS — E785 Hyperlipidemia, unspecified: Secondary | ICD-10-CM | POA: Diagnosis not present

## 2022-05-03 DIAGNOSIS — Z955 Presence of coronary angioplasty implant and graft: Secondary | ICD-10-CM | POA: Diagnosis not present

## 2022-05-03 DIAGNOSIS — I951 Orthostatic hypotension: Secondary | ICD-10-CM | POA: Diagnosis not present

## 2022-05-03 DIAGNOSIS — E119 Type 2 diabetes mellitus without complications: Secondary | ICD-10-CM | POA: Diagnosis not present

## 2022-05-03 DIAGNOSIS — I10 Essential (primary) hypertension: Secondary | ICD-10-CM | POA: Diagnosis not present

## 2022-05-07 DIAGNOSIS — Z955 Presence of coronary angioplasty implant and graft: Secondary | ICD-10-CM | POA: Diagnosis not present

## 2022-05-07 DIAGNOSIS — E785 Hyperlipidemia, unspecified: Secondary | ICD-10-CM | POA: Diagnosis not present

## 2022-05-07 DIAGNOSIS — I951 Orthostatic hypotension: Secondary | ICD-10-CM | POA: Diagnosis not present

## 2022-05-07 DIAGNOSIS — E119 Type 2 diabetes mellitus without complications: Secondary | ICD-10-CM | POA: Diagnosis not present

## 2022-05-07 DIAGNOSIS — I10 Essential (primary) hypertension: Secondary | ICD-10-CM | POA: Diagnosis not present

## 2022-05-08 DIAGNOSIS — E119 Type 2 diabetes mellitus without complications: Secondary | ICD-10-CM | POA: Diagnosis not present

## 2022-05-08 DIAGNOSIS — E785 Hyperlipidemia, unspecified: Secondary | ICD-10-CM | POA: Diagnosis not present

## 2022-05-08 DIAGNOSIS — I10 Essential (primary) hypertension: Secondary | ICD-10-CM | POA: Diagnosis not present

## 2022-05-08 DIAGNOSIS — I951 Orthostatic hypotension: Secondary | ICD-10-CM | POA: Diagnosis not present

## 2022-05-08 DIAGNOSIS — Z955 Presence of coronary angioplasty implant and graft: Secondary | ICD-10-CM | POA: Diagnosis not present

## 2022-05-10 DIAGNOSIS — E785 Hyperlipidemia, unspecified: Secondary | ICD-10-CM | POA: Diagnosis not present

## 2022-05-10 DIAGNOSIS — E119 Type 2 diabetes mellitus without complications: Secondary | ICD-10-CM | POA: Diagnosis not present

## 2022-05-10 DIAGNOSIS — Z955 Presence of coronary angioplasty implant and graft: Secondary | ICD-10-CM | POA: Diagnosis not present

## 2022-05-10 DIAGNOSIS — I10 Essential (primary) hypertension: Secondary | ICD-10-CM | POA: Diagnosis not present

## 2022-05-10 DIAGNOSIS — I951 Orthostatic hypotension: Secondary | ICD-10-CM | POA: Diagnosis not present

## 2022-05-13 DIAGNOSIS — I951 Orthostatic hypotension: Secondary | ICD-10-CM | POA: Diagnosis not present

## 2022-05-13 DIAGNOSIS — Z955 Presence of coronary angioplasty implant and graft: Secondary | ICD-10-CM | POA: Diagnosis not present

## 2022-05-13 DIAGNOSIS — E119 Type 2 diabetes mellitus without complications: Secondary | ICD-10-CM | POA: Diagnosis not present

## 2022-05-13 DIAGNOSIS — E785 Hyperlipidemia, unspecified: Secondary | ICD-10-CM | POA: Diagnosis not present

## 2022-05-13 DIAGNOSIS — I10 Essential (primary) hypertension: Secondary | ICD-10-CM | POA: Diagnosis not present

## 2022-05-15 DIAGNOSIS — E785 Hyperlipidemia, unspecified: Secondary | ICD-10-CM | POA: Diagnosis not present

## 2022-05-15 DIAGNOSIS — I951 Orthostatic hypotension: Secondary | ICD-10-CM | POA: Diagnosis not present

## 2022-05-15 DIAGNOSIS — E119 Type 2 diabetes mellitus without complications: Secondary | ICD-10-CM | POA: Diagnosis not present

## 2022-05-15 DIAGNOSIS — Z955 Presence of coronary angioplasty implant and graft: Secondary | ICD-10-CM | POA: Diagnosis not present

## 2022-05-15 DIAGNOSIS — I10 Essential (primary) hypertension: Secondary | ICD-10-CM | POA: Diagnosis not present

## 2022-05-17 DIAGNOSIS — Z955 Presence of coronary angioplasty implant and graft: Secondary | ICD-10-CM | POA: Diagnosis not present

## 2022-05-17 DIAGNOSIS — I10 Essential (primary) hypertension: Secondary | ICD-10-CM | POA: Diagnosis not present

## 2022-05-17 DIAGNOSIS — E119 Type 2 diabetes mellitus without complications: Secondary | ICD-10-CM | POA: Diagnosis not present

## 2022-05-17 DIAGNOSIS — E785 Hyperlipidemia, unspecified: Secondary | ICD-10-CM | POA: Diagnosis not present

## 2022-05-17 DIAGNOSIS — I951 Orthostatic hypotension: Secondary | ICD-10-CM | POA: Diagnosis not present

## 2022-05-20 DIAGNOSIS — E785 Hyperlipidemia, unspecified: Secondary | ICD-10-CM | POA: Diagnosis not present

## 2022-05-20 DIAGNOSIS — Z955 Presence of coronary angioplasty implant and graft: Secondary | ICD-10-CM | POA: Diagnosis not present

## 2022-05-20 DIAGNOSIS — E119 Type 2 diabetes mellitus without complications: Secondary | ICD-10-CM | POA: Diagnosis not present

## 2022-05-20 DIAGNOSIS — I951 Orthostatic hypotension: Secondary | ICD-10-CM | POA: Diagnosis not present

## 2022-05-20 DIAGNOSIS — I10 Essential (primary) hypertension: Secondary | ICD-10-CM | POA: Diagnosis not present

## 2022-05-22 DIAGNOSIS — I951 Orthostatic hypotension: Secondary | ICD-10-CM | POA: Diagnosis not present

## 2022-05-22 DIAGNOSIS — Z955 Presence of coronary angioplasty implant and graft: Secondary | ICD-10-CM | POA: Diagnosis not present

## 2022-05-22 DIAGNOSIS — E785 Hyperlipidemia, unspecified: Secondary | ICD-10-CM | POA: Diagnosis not present

## 2022-05-22 DIAGNOSIS — E119 Type 2 diabetes mellitus without complications: Secondary | ICD-10-CM | POA: Diagnosis not present

## 2022-05-22 DIAGNOSIS — I10 Essential (primary) hypertension: Secondary | ICD-10-CM | POA: Diagnosis not present

## 2022-05-24 DIAGNOSIS — Z955 Presence of coronary angioplasty implant and graft: Secondary | ICD-10-CM | POA: Diagnosis not present

## 2022-05-24 DIAGNOSIS — E119 Type 2 diabetes mellitus without complications: Secondary | ICD-10-CM | POA: Diagnosis not present

## 2022-05-24 DIAGNOSIS — E785 Hyperlipidemia, unspecified: Secondary | ICD-10-CM | POA: Diagnosis not present

## 2022-05-24 DIAGNOSIS — I951 Orthostatic hypotension: Secondary | ICD-10-CM | POA: Diagnosis not present

## 2022-05-24 DIAGNOSIS — I10 Essential (primary) hypertension: Secondary | ICD-10-CM | POA: Diagnosis not present

## 2022-05-27 DIAGNOSIS — Z955 Presence of coronary angioplasty implant and graft: Secondary | ICD-10-CM | POA: Diagnosis not present

## 2022-05-27 DIAGNOSIS — I10 Essential (primary) hypertension: Secondary | ICD-10-CM | POA: Diagnosis not present

## 2022-05-27 DIAGNOSIS — E785 Hyperlipidemia, unspecified: Secondary | ICD-10-CM | POA: Diagnosis not present

## 2022-05-27 DIAGNOSIS — I951 Orthostatic hypotension: Secondary | ICD-10-CM | POA: Diagnosis not present

## 2022-05-27 DIAGNOSIS — E119 Type 2 diabetes mellitus without complications: Secondary | ICD-10-CM | POA: Diagnosis not present

## 2022-05-29 ENCOUNTER — Telehealth: Payer: Self-pay | Admitting: Cardiology

## 2022-05-29 NOTE — Telephone Encounter (Signed)
Pt c/o BP issue: STAT if pt c/o blurred vision, one-sided weakness or slurred speech  1. What are your last 5 BP readings?  95/66 - This morning 85/58 - 9/26 87/59 - 9/25 73/49 - 9/25  2. Are you having any other symptoms (ex. Dizziness, headache, blurred vision, passed out)? Dizziness, lightheadedness and tired  3. What is your BP issue? Spouse states that pt's BP has been running low since Monday.

## 2022-05-29 NOTE — Telephone Encounter (Signed)
Spouse reports hypotension since Monday. These readings are the same throughout the day, not just the AM. 95/66 - This morning 85/58 - 9/26 87/59 - 9/25 73/49 - 9/25 Patient reports lightheadedness, especially when standing from sitting. He is using support stockings, staying hydrated and using salt liberally. Please advise on BP.

## 2022-05-30 ENCOUNTER — Other Ambulatory Visit: Payer: Self-pay

## 2022-05-30 MED ORDER — MIDODRINE HCL 2.5 MG PO TABS
2.5000 mg | ORAL_TABLET | Freq: Three times a day (TID) | ORAL | 6 refills | Status: DC
Start: 2022-05-30 — End: 2022-06-20

## 2022-05-30 NOTE — Telephone Encounter (Signed)
Spoke to patient's wife.Dr.Jordan's advice given. He will start Midodrine 2.5 mg three times a day.Advised to keep appointment already scheduled 10/19 with Almyra Deforest PA.Advised to call sooner if needed.

## 2022-06-07 ENCOUNTER — Ambulatory Visit: Payer: Medicare HMO | Admitting: Cardiology

## 2022-06-11 DIAGNOSIS — Z23 Encounter for immunization: Secondary | ICD-10-CM | POA: Diagnosis not present

## 2022-06-11 DIAGNOSIS — Z955 Presence of coronary angioplasty implant and graft: Secondary | ICD-10-CM | POA: Diagnosis not present

## 2022-06-11 DIAGNOSIS — K219 Gastro-esophageal reflux disease without esophagitis: Secondary | ICD-10-CM | POA: Diagnosis not present

## 2022-06-11 DIAGNOSIS — I2510 Atherosclerotic heart disease of native coronary artery without angina pectoris: Secondary | ICD-10-CM | POA: Diagnosis not present

## 2022-06-11 DIAGNOSIS — E1140 Type 2 diabetes mellitus with diabetic neuropathy, unspecified: Secondary | ICD-10-CM | POA: Diagnosis not present

## 2022-06-19 NOTE — Progress Notes (Unsigned)
Cardiology Office Note:    Date:  06/20/2022   ID:  Ivan Dickson, DOB 11/22/1950, MRN [id number]  PCP:  Garwin Brothers, [place] [facility] Cardiologist:  Peter Martinique, MD     Referring MD: Garwin Brothers, MD   CC: Dizziness follow-up  History of Present Illness:    Ivan Dickson is a 71 y.o. male with a hx of the following:   Coronary artery disease, drug-eluting stent to mid RCA Aortic atherosclerosis HLD HTN T2DM Lightheadedness/dizziness   Initially evaluated by Dr. Martinique in April 2023 for chief complaint of lightheadedness and dizziness when working out in the yard, noted to be worse with squatting or bending over.  BP was as low as 60/40.  Was on lisinopril for renal protection but this was discontinued and did not experience any change in symptoms.  Denied any syncope but noted that when this happened, he needed to stop and rest.  Does note exertional pain in neck and shoulders.  Does have family history of coronary artery disease and has risk factors of hypertension, hyperlipidemia, and type 2 diabetes.  Has not seen cardiologist for couple years, last was evaluated by cardiology in [place].  At that time he has had a history of a stress test, echo, and Zio patch.  EKG in office revealed normal sinus rhythm without any acute ischemic changes, 61 bpm.  Dr. Martinique arrange coronary CTA to establish CV risk given his multiple risk factors.  Coronary CTA revealed coronary calcium score of 1129, 88th percentile for his demographic, obstructive CAD with normal coronary origin with right dominance.  Mixed plaque was noted in mid RCA causing severe 70 to 99% stenosis, mixed plaque in proximal LAD causing moderate 50 to 69% stenosis.  CAD-RADS 4.  CT FFR suggested obstructive CAD in proximal LAD and mid RCA, cardiac catheterization was recommended.  Aortic atherosclerosis also noted.  Underwent left heart cath on Jan 22, 2022 that revealed single-vessel obstructive CAD, normal LV  function, normal LVEDP and received successful PCI of mid RCA with 1 drug-eluting stent due to proximal to mid RCA lesion at 68% stenosis and mid RCA lesion was at 90% stenosis.  It was recommended that the proximal LAD did not appear obstructive on cath even though it was abnormal with FFR and CTA.  Plan to treat medically unless he had significant angina, DAPT recommended to continue at least 6 months.  At follow-up with Dr. Martinique in June 2023, he reported feeling well.  Continue to have a little lightheadedness when working outside, did note some bruising while on Plavix.  Dr. Martinique suspected he had some degree of autonomic dysfunction related to his diabetes causing his lightheadedness and dizziness.  He recommended maintaining good hydration, wearing support hose, and liberalizing sodium in his diet.  He contacted our office in September 2023 reporting low blood pressures and noting lightheadedness, especially when standing from sitting, despite performing conservative measures as recommended by Dr. Martinique.  Because he was not on any hypertensives causing his symptoms, Dr. Martinique recommended starting midodrine 2.5 mg 3 times daily and to continue monitor blood pressure.  Was advised to follow-up in office with APP.  Today he presents for follow-up.  He states he is finished cardiac rehab.  Overall tolerated this very well, however near the end of cardiac rehab he had an episode of orthostatic hypotension, SBP dropped to 80s, and was symptomatic.  States after finishing cardiac rehab, symptoms have improved, less frequent in duration. Typically this  is noticed when he is outside performing yard work.  Denies any chest pain, shortness of breath, palpitations, tachycardia, PE, presyncope, swelling or significant weight changes, acute bleeding, or claudication.  Stated he could not tolerate midodrine low-dose, as this caused bad bloating and gas.  Blood pressure readings from cardiac rehab are overall stable,  denies any BP abnormalities at home.  Denies any other questions or concerns today.   Past Medical History:  Diagnosis Date   Aortic atherosclerosis (HCC)    CAD (coronary artery disease)    s/p DES to mid RCA 12/2021   Diabetes mellitus without complication (HCC)    Dizziness    GERD (gastroesophageal reflux disease)    High cholesterol    History of colon polyps    HTN (hypertension)    Lightheadedness     Past Surgical History:  Procedure Laterality Date   APPENDECTOMY     COLONOSCOPY     found polyps over 10 years ago   CORONARY STENT INTERVENTION N/A 01/22/2022   Procedure: CORONARY STENT INTERVENTION;  Surgeon: Martinique, Peter M, MD;  Location: [facility];  Service: Cardiovascular;  Laterality: N/A;   ESOPHAGOGASTRODUODENOSCOPY     over 10 years ago thinks he did   HAND SURGERY Right    LEFT HEART CATH AND CORONARY ANGIOGRAPHY N/A 01/22/2022   Procedure: LEFT HEART CATH AND CORONARY ANGIOGRAPHY;  Surgeon: Martinique, Peter M, MD;  Location: [facility];  Service: Cardiovascular;  Laterality: N/A;    Current Medications: Current Meds  Medication Sig   aspirin EC 81 MG tablet Take 1 tablet (81 mg total) by mouth daily. Swallow whole.   Blood Glucose Monitoring Suppl (TRUE METRIX METER) w/Device KIT See admin instructions.   clopidogrel (PLAVIX) 75 MG tablet Take 1 tablet (75 mg total) by mouth daily with breakfast.   glipiZIDE (GLUCOTROL) 10 MG tablet Take 10 mg by mouth 2 (two) times daily before a meal.   metFORMIN (GLUCOPHAGE-XR) 500 MG 24 hr tablet Take 1,000 mg by mouth 2 (two) times daily.   nitroGLYCERIN (NITROSTAT) 0.4 MG SL tablet Place 1 tablet (0.4 mg total) under the tongue every 5 (five) minutes as needed.   OZEMPIC, 0.25 OR 0.5 MG/DOSE, 2 MG/1.5ML SOPN Inject 0.5 mg into the skin every Friday.   pantoprazole (PROTONIX) 40 MG tablet Take 40 mg by mouth 2 (two) times daily.   pioglitazone (ACTOS) 30 MG tablet Take 30 mg by mouth daily.   rosuvastatin  (CRESTOR) 40 MG tablet Take 1 tablet (40 mg total) by mouth daily.   TRUE METRIX BLOOD GLUCOSE TEST test strip daily.     Allergies:   Midodrine and Ciprofloxacin   Social History   Socioeconomic History   Marital status: Married    Spouse name: Not on file   Number of children: 2   Years of education: Not on file   Highest education level: Not on file  Occupational History   Not on file  Tobacco Use   Smoking status: Never   Smokeless tobacco: Never  Vaping Use   Vaping Use: Never used  Substance and Sexual Activity   Alcohol use: Yes    Comment: weekly   Drug use: Not Currently   Sexual activity: Not on file  Other Topics Concern   Not on file  Social History Narrative   Retired from [employer] Strain: Not on file  Food Insecurity: Not on file  Transportation Needs: Not on file  Physical Activity: Not on file  Stress: Not on file  Social Connections: Not on file     Family History: The patient's family history includes Arrhythmia in his sister; Breast cancer in his sister; Diabetes in his mother; Heart attack in his brother; Heart disease in his brother and mother; Heart failure in his son; Hyperlipidemia in his mother; Hypertension in his mother; Kidney cancer in his sister; Stroke in his father. There is no history of Colon cancer or Esophageal cancer.  ROS:   Review of Systems  Constitutional: Negative.   HENT: Negative.    Eyes: Negative.   Respiratory: Negative.    Cardiovascular: Negative.   Gastrointestinal: Negative.   Genitourinary: Negative.   Musculoskeletal: Negative.   Skin: Negative.   Neurological:  Positive for dizziness. Negative for tingling, tremors, sensory change, speech change, focal weakness, seizures, loss of consciousness, weakness and headaches.  Endo/Heme/Allergies:  Negative for environmental allergies and polydipsia. Does not bruise/bleed easily.  Psychiatric/Behavioral:  Negative.      Please see the history of present illness.    All other systems reviewed and are negative.  EKGs/Labs/Other Studies Reviewed:    The following studies were reviewed today:   EKG:  EKG is not ordered today.   Left heart catheterization coronary angiography on Jan 22, 2022:   Prox LAD to Mid LAD lesion is 50% stenosed.   1st Diag lesion is 70% stenosed.   2nd Diag lesion is 70% stenosed.   Prox RCA to Mid RCA lesion is 60% stenosed.   Mid RCA lesion is 90% stenosed.   A drug-eluting stent was successfully placed using a SYNERGY XD [id number].   Post intervention, there is a 0% residual stenosis.   Post intervention, there is a 0% residual stenosis.   The left ventricular systolic function is normal.   LV end diastolic pressure is normal.   The left ventricular ejection fraction is 55-65% by visual estimate.   There is no mitral valve regurgitation.   Single vessel obstructive CAD Normal LV function Normal LVEDP Successful PCI of the mid RCA with DES x 1   Plan: the proximal LAD does not appear obstructive on Cath ( FFR abnormal on CTA). Will treat medically unless he has significant angina. Continue DAPT for at least 6 months. Anticipate same day DC.   Coronary CTA on Jan 09, 2022: 1. Coronary calcium score of 1129. This was 88th percentile for age and sex matched control. 2.  Normal coronary origin with right dominance. 3.  Obstructive CAD 4.  Mixed plaque in mid RCA causes severe (70-99%) stenosis 5. Mixed plaque in the proximal LAD causes moderate (50-69%) stenosis. 6.  Will send for CTFFR CAD-RADS 4 Severe stenosis. (70-99% or > 50% left main). Cardiac catheterization or CT FFR is recommended. Consider symptom-guided anti-ischemic pharmacotherapy as well as risk factor modification per guideline directed care. 7.  CT FFR suggest obstructive CAD in proximal LAD and mid RCA.  Cardiac catheterization recommended.   Recent Labs: 11/13/2021: ALT 19; TSH  3.04 01/15/2022: BUN 24; Creatinine, Ser 1.23; Hemoglobin 12.2; Platelets 212; Potassium 5.1; Sodium 140  Recent Lipid Panel    Component Value Date/Time   CHOL 135 12/26/2021 0941   TRIG 111 12/26/2021 0941   HDL 45 12/26/2021 0941   CHOLHDL 3.0 12/26/2021 0941   LDLCALC 70 12/26/2021 0941     Risk Assessment/Calculations:        The 10-year ASCVD risk score (Arnett DK, et  al., 2019) is: 31.2%   Values used to calculate the score:     Age: 47 years     Sex: Male     Is Non-Hispanic African American: No     Diabetic: Yes     Tobacco smoker: No     Systolic Blood Pressure: 294 mmHg     Is BP treated: Yes     HDL Cholesterol: 45 mg/dL     Total Cholesterol: 135 mg/dL   Physical Exam:    VS:  BP 126/62 (BP Location: Left Arm, Patient Position: Sitting, Cuff Size: Normal)   Pulse 83   Ht 6' (1.829 m)   Wt 200 lb 12.8 oz (91.1 kg)   SpO2 97%   BMI 27.23 kg/m     Wt Readings from Last 3 Encounters:  06/20/22 200 lb 12.8 oz (91.1 kg)  02/06/22 197 lb (89.4 kg)  01/22/22 200 lb (90.7 kg)    Orthostatic VS for the past 72 hrs (Last 3 readings):  Orthostatic BP Patient Position BP Location Cuff Size Orthostatic Pulse  06/20/22 0816 102/85 Lying left side Left Arm Normal 96  06/20/22 0815 118/60 Lying left side Left Arm Normal 77  06/20/22 0814 128/56 Lying left side Left Arm Normal 72    GEN: Well nourished, well developed 71 y.o. Caucasian male in no acute distress HEENT: Normal NECK: No JVD; No carotid bruits CARDIAC: S1/S2, RRR, no murmurs, rubs, gallops; 2+ peripheral pulses throughout, strong and equal bilaterally RESPIRATORY:  Clear to auscultation without rales, wheezing or rhonchi  MUSCULOSKELETAL:  No edema; No deformity  SKIN: Thin skin, warm and dry NEUROLOGIC:  Alert and oriented x 3 PSYCHIATRIC:  Normal affect   ASSESSMENT:    1. Coronary artery disease involving native heart without angina pectoris, unspecified vessel or lesion type   2. S/P coronary  artery stent placement   3. Aortic atherosclerosis ([place])   4. Hyperlipidemia, unspecified hyperlipidemia type   5. Hypertension, unspecified type   6. Idiopathic hypotension   7. Lightheadedness   8. Dizziness   9. Type 2 diabetes mellitus without complication, without long-term current use of insulin (HCC)    PLAN:    In order of problems listed above:  Coronary artery disease, status post drug-eluting stent to mid RCA, aortic atherosclerosis Coronary cath from May 2023 revealed single-vessel obstructive CAD, with successful PCI of mid RCA with 1 drug-eluting stent.  Stable with no anginal symptoms. No indication for ischemic evaluation.  Has completed cardiac rehab.  Continue DAPT for minimum of 6 months to 1 year.  Continue aspirin, Plavix, nitroglycerin as needed, and Crestor. Heart healthy diet and regular cardiovascular exercise encouraged.   2. Hyperlipidemia Most recent labs from April 2023 revealed total cholesterol 135, HDL 45, LDL 70, triglycerides 111.  Tolerating medication well.  Continue Crestor. Heart healthy diet and regular cardiovascular exercise encouraged.   3. Hypertension Blood pressure today 128/56.  Did have blood pressure drop with standing but was not considered significant to be orthostatic hypotension.  Did state he had an episode of orthostatic hypotension when at cardiac rehab.  Overall BP is well controlled at home and stable today at office visit.  Currently not on any blood pressure medication.  BP monitor given to log and bring to next follow-up visit.  If SBP increases to greater than 140, plan to initiate low-dose beta-blocker.   4. Lightheadedness/dizziness Felt to be due to autonomic dysfunction associated with history of type 2 diabetes.  Does not appear  to be POTS related.  Did have drop in blood pressure with orthostatics, but not consider orthostatic hypotension.  Unable to tolerate midodrine and will discontinue this medicine and add to allergy list.  Initiate Florinef 0.1 mg daily, after 1 week of symptoms are not improved, may increase to 0.2 mg daily.  Discussed to monitor blood sugar well and to report abnormal readings to PCP.  We will arrange 14-day ZIO monitor to evaluate for any kind of arrhythmias that would explain autonomic dysfunction, he is in SR today on exam.  Will obtain CBC, CMET, and magnesium today.  5. Type 2 diabetes PCP to manage.  Continue glipizide, metformin, Actos, and Ozempic.   6.  Disposition: Follow-up with Dr. Martinique or APP in 6 to 8 weeks or sooner if anything changes.    Medication Adjustments/Labs and Tests Ordered: Current medicines are reviewed at length with the patient today.  Concerns regarding medicines are outlined above.  Orders Placed This Encounter  Procedures   CBC   Comprehensive metabolic panel   Magnesium   LONG TERM MONITOR (3-14 DAYS)   Meds ordered this encounter  Medications   fludrocortisone (FLORINEF) 0.1 MG tablet    Sig: Take 1 tablet (0.1 mg total) by mouth daily.    Dispense:  90 tablet    Refill:  3    Patient Instructions  Medication Instructions:  START Florinef 0.1 mg daily. If your symptoms do not improve or worsen after a week you may INCREASE Florinef to 0.2 mg daily  *If you need a refill on your cardiac medications before your next appointment, please call your pharmacy*  Lab Work: Your physician recommends that you return for lab work TODAY:  CBC CMP Magnesium   If you have labs (blood work) drawn today and your tests are completely normal, you will receive your results only by: [place] (if you have MyChart) OR A paper copy in the mail If you have any lab test that is abnormal or we need to change your treatment, we will call you to review the results.  Testing/Procedures:  [facility]- Long Term Monitor Instructions  Finis Bud, NP has requested you wear a ZIO patch monitor for 14 days.  This is a single patch monitor. [employer] supplies one  patch monitor per enrollment. Additional stickers are not available. Please do not apply patch if you will be having a Nuclear Stress Test,  Echocardiogram, Cardiac CT, MRI, or Chest Xray during the period you would be wearing the  monitor. The patch cannot be worn during these tests. You cannot remove and re-apply the  ZIO XT patch monitor.  Your ZIO patch monitor will be mailed 3 day USPS to your address on file. It may take 3-5 days  to receive your monitor after you have been enrolled.  Once you have received your monitor, please review the enclosed instructions. Your monitor  has already been registered assigning a specific monitor serial # to you.  Billing and Patient Assistance Program Information  We have supplied [employer] with any of your insurance information on file for billing purposes. [employer] offers a sliding scale Patient Assistance Program for patients that do not have  insurance, or whose insurance does not completely cover the cost of the ZIO monitor.  You must apply for the Patient Assistance Program to qualify for this discounted rate.  To apply, please call [employer] at [phone], select option 4, select option 2, ask to apply for  Patient Assistance Program. Theodore Demark will  ask your household income, and how many people  are in your household. They will quote your out-of-pocket cost based on that information.  [employer] will also be able to set up a 81-month, interest-free payment plan if needed.  Applying the monitor   Shave hair from upper left chest.  Hold abrader disc by orange tab. Rub abrader in 40 strokes over the upper left chest as  indicated in your monitor instructions.  Clean area with 4 enclosed alcohol pads. Let dry.  Apply patch as indicated in monitor instructions. Patch will be placed under collarbone on left  side of chest with arrow pointing upward.  Rub patch adhesive wings for 2 minutes. Remove white label marked "1". Remove the white  label marked  "2". Rub patch adhesive wings for 2 additional minutes.  While looking in a mirror, press and release button in center of patch. A small green light will  flash 3-4 times. This will be your only indicator that the monitor has been turned on.  Do not shower for the first 24 hours. You may shower after the first 24 hours.  Press the button if you feel a symptom. You will hear a small click. Record Date, Time and  Symptom in the Patient Logbook.  When you are ready to remove the patch, follow instructions on the last 2 pages of Patient  Logbook. Stick patch monitor onto the last page of Patient Logbook.  Place Patient Logbook in the blue and white box. Use locking tab on box and tape box closed  securely. The blue and white box has prepaid postage on it. Please place it in the mailbox as  soon as possible. Your physician should have your test results approximately 7 days after the  monitor has been mailed back to [facility].  Call [place] at [phone] if you have questions regarding  your ZIO XT patch monitor. Call them immediately if you see an orange light blinking on your  monitor.  If your monitor falls off in less than 4 days, contact our Monitor department at [phone].  If your monitor becomes loose or falls off after 4 days call [employer] at [phone] for  suggestions on securing your monitor   Follow-Up: At [facility], you and your health needs are our priority.  As part of our continuing mission to provide you with exceptional heart care, we have created designated Provider Care Teams.  These Care Teams include your primary Cardiologist (physician) and Advanced Practice Providers (APPs -  Physician Assistants and Nurse Practitioners) who all work together to provide you with the care you need, when you need it.  Your next appointment:   6-8 week(s)  The format for your next appointment:   In Person  Provider:   Peter Martinique, MD  or   APP         Other Instructions Monitor blood pressure (BP) at home. If your systolic (top number) of blood pressure is 90 or less consistently please give our office a call. Bring blood pressure log with you to follow up appointment   Important Information About Sugar         Signed, Finis Bud, NP  06/20/2022 1:08 PM    [place]

## 2022-06-20 ENCOUNTER — Telehealth: Payer: Self-pay | Admitting: Cardiology

## 2022-06-20 ENCOUNTER — Ambulatory Visit: Payer: Medicare HMO | Attending: Nurse Practitioner

## 2022-06-20 ENCOUNTER — Encounter: Payer: Self-pay | Admitting: Physician Assistant

## 2022-06-20 ENCOUNTER — Ambulatory Visit: Payer: Medicare HMO | Attending: Cardiology | Admitting: Nurse Practitioner

## 2022-06-20 VITALS — BP 126/62 | HR 83 | Ht 72.0 in | Wt 200.8 lb

## 2022-06-20 DIAGNOSIS — E785 Hyperlipidemia, unspecified: Secondary | ICD-10-CM | POA: Diagnosis not present

## 2022-06-20 DIAGNOSIS — E1169 Type 2 diabetes mellitus with other specified complication: Secondary | ICD-10-CM | POA: Insufficient documentation

## 2022-06-20 DIAGNOSIS — I950 Idiopathic hypotension: Secondary | ICD-10-CM | POA: Diagnosis not present

## 2022-06-20 DIAGNOSIS — I10 Essential (primary) hypertension: Secondary | ICD-10-CM | POA: Diagnosis not present

## 2022-06-20 DIAGNOSIS — R42 Dizziness and giddiness: Secondary | ICD-10-CM

## 2022-06-20 DIAGNOSIS — Z955 Presence of coronary angioplasty implant and graft: Secondary | ICD-10-CM | POA: Diagnosis not present

## 2022-06-20 DIAGNOSIS — I2510 Atherosclerotic heart disease of native coronary artery without angina pectoris: Secondary | ICD-10-CM

## 2022-06-20 DIAGNOSIS — I700 Atherosclerosis of aorta: Secondary | ICD-10-CM | POA: Diagnosis not present

## 2022-06-20 DIAGNOSIS — E119 Type 2 diabetes mellitus without complications: Secondary | ICD-10-CM

## 2022-06-20 MED ORDER — FLUDROCORTISONE ACETATE 0.1 MG PO TABS: 0.1000 mg | ORAL_TABLET | Freq: Every day | ORAL | 3 refills | Status: DC

## 2022-06-20 NOTE — Patient Instructions (Addendum)
Medication Instructions:  START Florinef 0.1 mg daily. If your symptoms do not improve or worsen after a week you may INCREASE Florinef to 0.2 mg daily  *If you need a refill on your cardiac medications before your next appointment, please call your pharmacy*  Lab Work: Your physician recommends that you return for lab work TODAY:  CBC CMP Magnesium   If you have labs (blood work) drawn today and your tests are completely normal, you will receive your results only by: [place] (if you have MyChart) OR A paper copy in the mail If you have any lab test that is abnormal or we need to change your treatment, we will call you to review the results.  Testing/Procedures:  Bryn Gulling- Long Term Monitor Instructions  Finis Bud, NP has requested you wear a ZIO patch monitor for 14 days.  This is a single patch monitor. [employer] supplies one patch monitor per enrollment. Additional stickers are not available. Please do not apply patch if you will be having a Nuclear Stress Test,  Echocardiogram, Cardiac CT, MRI, or Chest Xray during the period you would be wearing the  monitor. The patch cannot be worn during these tests. You cannot remove and re-apply the  ZIO XT patch monitor.  Your ZIO patch monitor will be mailed 3 day USPS to your address on file. It may take 3-5 days  to receive your monitor after you have been enrolled.  Once you have received your monitor, please review the enclosed instructions. Your monitor  has already been registered assigning a specific monitor serial # to you.  Billing and Patient Assistance Program Information  We have supplied [employer] with any of your insurance information on file for billing purposes. [employer] offers a sliding scale Patient Assistance Program for patients that do not have  insurance, or whose insurance does not completely cover the cost of the ZIO monitor.  You must apply for the Patient Assistance Program to qualify for this discounted  rate.  To apply, please call [employer] at [phone], select option 4, select option 2, ask to apply for  Patient Assistance Program. Theodore Demark will ask your household income, and how many people  are in your household. They will quote your out-of-pocket cost based on that information.  [employer] will also be able to set up a 55-month, interest-free payment plan if needed.  Applying the monitor   Shave hair from upper left chest.  Hold abrader disc by orange tab. Rub abrader in 40 strokes over the upper left chest as  indicated in your monitor instructions.  Clean area with 4 enclosed alcohol pads. Let dry.  Apply patch as indicated in monitor instructions. Patch will be placed under collarbone on left  side of chest with arrow pointing upward.  Rub patch adhesive wings for 2 minutes. Remove white label marked "1". Remove the white  label marked "2". Rub patch adhesive wings for 2 additional minutes.  While looking in a mirror, press and release button in center of patch. A small green light will  flash 3-4 times. This will be your only indicator that the monitor has been turned on.  Do not shower for the first 24 hours. You may shower after the first 24 hours.  Press the button if you feel a symptom. You will hear a small click. Record Date, Time and  Symptom in the Patient Logbook.  When you are ready to remove the patch, follow instructions on the last 2 pages of Patient  Logbook. Stick patch monitor onto the last page of Patient Logbook.  Place Patient Logbook in the blue and white box. Use locking tab on box and tape box closed  securely. The blue and white box has prepaid postage on it. Please place it in the mailbox as  soon as possible. Your physician should have your test results approximately 7 days after the  monitor has been mailed back to [facility].  Call [place] at [phone] if you have questions regarding  your ZIO XT patch monitor. Call them  immediately if you see an orange light blinking on your  monitor.  If your monitor falls off in less than 4 days, contact our Monitor department at [phone].  If your monitor becomes loose or falls off after 4 days call [employer] at [phone] for  suggestions on securing your monitor   Follow-Up: At [facility], you and your health needs are our priority.  As part of our continuing mission to provide you with exceptional heart care, we have created designated Provider Care Teams.  These Care Teams include your primary Cardiologist (physician) and Advanced Practice Providers (APPs -  Physician Assistants and Nurse Practitioners) who all work together to provide you with the care you need, when you need it.  Your next appointment:   6-8 week(s)  The format for your next appointment:   In Person  Provider:   Peter Martinique, MD  or  APP         Other Instructions Monitor blood pressure (BP) at home. If your systolic (top number) of blood pressure is 90 or less consistently please give our office a call. Bring blood pressure log with you to follow up appointment   Important Information About Sugar

## 2022-06-20 NOTE — Telephone Encounter (Signed)
Routed to Dr. Martinique and Malachy Mood LPN

## 2022-06-20 NOTE — Telephone Encounter (Signed)
Spoke to patient Dr.Jordan advised ok to take antibiotics.

## 2022-06-20 NOTE — Progress Notes (Unsigned)
Enrolled for [employer] to mail a ZIO XT long term holter monitor to the patients address on file.   Dr. Jordan to read. 

## 2022-06-20 NOTE — Telephone Encounter (Signed)
Patient was seen today and wanted to add that he has an abscess on his tooth and started antibiotics yesterday.

## 2022-06-21 LAB — CBC
Hematocrit: 40.4 % (ref 37.5–51.0)
Hemoglobin: 12.8 g/dL — ABNORMAL LOW (ref 13.0–17.7)
MCH: 29 pg (ref 26.6–33.0)
MCHC: 31.7 g/dL (ref 31.5–35.7)
MCV: 91 fL (ref 79–97)
Platelets: 208 10*3/uL (ref 150–450)
RBC: 4.42 x10E6/uL (ref 4.14–5.80)
RDW: 12.4 % (ref 11.6–15.4)
WBC: 7.7 10*3/uL (ref 3.4–10.8)

## 2022-06-21 LAB — COMPREHENSIVE METABOLIC PANEL
ALT: 15 IU/L (ref 0–44)
AST: 14 IU/L (ref 0–40)
Albumin/Globulin Ratio: 1.7 (ref 1.2–2.2)
Albumin: 4.3 g/dL (ref 3.9–4.9)
Alkaline Phosphatase: 65 IU/L (ref 44–121)
BUN/Creatinine Ratio: 21 (ref 10–24)
BUN: 23 mg/dL (ref 8–27)
Bilirubin Total: 0.4 mg/dL (ref 0.0–1.2)
CO2: 22 mmol/L (ref 20–29)
Calcium: 9.3 mg/dL (ref 8.6–10.2)
Chloride: 104 mmol/L (ref 96–106)
Creatinine, Ser: 1.12 mg/dL (ref 0.76–1.27)
Globulin, Total: 2.5 g/dL (ref 1.5–4.5)
Glucose: 243 mg/dL — ABNORMAL HIGH (ref 70–99)
Potassium: 4.8 mmol/L (ref 3.5–5.2)
Sodium: 141 mmol/L (ref 134–144)
Total Protein: 6.8 g/dL (ref 6.0–8.5)
eGFR: 71 mL/min/{1.73_m2} (ref >59)

## 2022-06-21 LAB — MAGNESIUM: Magnesium: 1.3 mg/dL — ABNORMAL LOW (ref 1.6–2.3)

## 2022-06-22 DIAGNOSIS — R42 Dizziness and giddiness: Secondary | ICD-10-CM | POA: Diagnosis not present

## 2022-07-10 DIAGNOSIS — R42 Dizziness and giddiness: Secondary | ICD-10-CM | POA: Diagnosis not present

## 2022-07-30 NOTE — Progress Notes (Unsigned)
Cardiology Office Note   Date:  08/01/2022   ID:  Ivan Dickson, DOB February 10, 1951, MRN [id number]  PCP:  Garwin Brothers, MD  Cardiologist:   Laresha Bacorn Martinique, MD   Chief Complaint  Patient presents with   Follow-up    6 weeks.   Coronary Artery Disease      History of Present Illness: Ivan Dickson is a 71 y.o. male who is seen for follow up CAD. Has history of HLD and DM type 2.   He reports over past year that he has symptoms of lightheadedness and dizziness when out working in the yard. Worse after squatting or bending over. Noted BP as low as 60/40. Was on lisinopril for renal protection but this has been discontinued. No real change in symptoms. When this happens he stops and rests. No true syncope.  Does complain of exertional pain in neck and shoulders. Did see a cardiologist a couple of years ago in [place]. Apparently had stress test, Echo, and Zio patch monitor then. Is very concerned about CV risk given risk factors of DM, HLD, and strong family history of CAD.   Subsequent coronary CTA indicated significant obstructive disease. He underwent cardiac cath demonstrating severe disease in the mid RCA that was successfully stented with DES. There was moderate disease in the diagonal branches but the LAD appeared nonobstructive (abnormal FFR on CT). He was treated with ASA and Plavix. Crestor increased to 40 mg. Lisinopril discontinued due to dizziness. His renal parameters improved off ACEi.  In October he was seen by Finis Bud NP for orthostatic hypotension while at cardiac Rehab. Intolerant of midodrine so was started on Florinef. Event monitor ordered showing brief runs of SVT otherwise benign. Patient reports Florinef resulted in elevated BP so he quit taking it. Brings extensive BP readings. These show consistent orthostatic hypotension with 30-40 mm Hg drop in BP with standing. He denies any chest pain or dyspnea. He states dizziness/lightneadedness improved but still notes when  he over exerts. He has had prior carpel tunnel surgery on left.    Past Medical History:  Diagnosis Date   Aortic atherosclerosis (HCC)    CAD (coronary artery disease)    s/p DES to mid RCA 12/2021   Diabetes mellitus without complication (HCC)    Dizziness    GERD (gastroesophageal reflux disease)    High cholesterol    History of colon polyps    HTN (hypertension)    Lightheadedness     Past Surgical History:  Procedure Laterality Date   APPENDECTOMY     COLONOSCOPY     found polyps over 10 years ago   CORONARY STENT INTERVENTION N/A 01/22/2022   Procedure: CORONARY STENT INTERVENTION;  Surgeon: Martinique, Caren Garske M, MD;  Location: [facility];  Service: Cardiovascular;  Laterality: N/A;   ESOPHAGOGASTRODUODENOSCOPY     over 10 years ago thinks he did   HAND SURGERY Right    LEFT HEART CATH AND CORONARY ANGIOGRAPHY N/A 01/22/2022   Procedure: LEFT HEART CATH AND CORONARY ANGIOGRAPHY;  Surgeon: Martinique, Rochanda Harpham M, MD;  Location: [facility];  Service: Cardiovascular;  Laterality: N/A;     Current Outpatient Medications  Medication Sig Dispense Refill   aspirin EC 81 MG tablet Take 1 tablet (81 mg total) by mouth daily. Swallow whole. 90 tablet 3   Blood Glucose Monitoring Suppl (TRUE METRIX METER) w/Device KIT See admin instructions.     glipiZIDE (GLUCOTROL) 10 MG tablet Take 10 mg by mouth 2 (  two) times daily before a meal.     Magnesium 400 MG CAPS Take 400 mg by mouth daily.     metFORMIN (GLUCOPHAGE-XR) 500 MG 24 hr tablet Take 1,000 mg by mouth 2 (two) times daily.     nitroGLYCERIN (NITROSTAT) 0.4 MG SL tablet Place 1 tablet (0.4 mg total) under the tongue every 5 (five) minutes as needed. 25 tablet 12   OZEMPIC, 0.25 OR 0.5 MG/DOSE, 2 MG/1.5ML SOPN Inject 0.5 mg into the skin every Friday.     pantoprazole (PROTONIX) 40 MG tablet Take 40 mg by mouth 2 (two) times daily.     pioglitazone (ACTOS) 30 MG tablet Take 30 mg by mouth daily.     rosuvastatin (CRESTOR)  40 MG tablet Take 1 tablet (40 mg total) by mouth daily. 30 tablet 6   TRUE METRIX BLOOD GLUCOSE TEST test strip daily.     No current facility-administered medications for this visit.    Allergies:   Midodrine and Ciprofloxacin    Social History:  The patient  reports that he has never smoked. He has never used smokeless tobacco. He reports current alcohol use. He reports that he does not currently use drugs.   Family History:  The patient's family history includes Arrhythmia in his sister; Breast cancer in his sister; Diabetes in his mother; Heart attack in his brother; Heart disease in his brother and mother; Heart failure in his son; Hyperlipidemia in his mother; Hypertension in his mother; Kidney cancer in his sister; Stroke in his father.    ROS:  Please see the history of present illness.   Otherwise, review of systems are positive for none.   All other systems are reviewed and negative.    PHYSICAL EXAM: VS:  BP 130/68 (BP Location: Left Arm, Patient Position: Sitting, Cuff Size: Normal)   Pulse 88   Ht 6' (1.829 m)   Wt 199 lb (90.3 kg)   BMI 26.99 kg/m  , BMI Body mass index is 26.99 kg/m. Gen: Well nourished, well developed, in no acute distress HEENT: normal Neck: no JVD, carotid bruits, or masses Cardiac: RRR; no murmurs, rubs, or gallops,no edema. No radial site hematoma Respiratory:  clear to auscultation bilaterally, normal work of breathing GI: soft, nontender, nondistended, + BS MS: no deformity or atrophy Skin: warm and dry, no rash Neuro:  Strength and sensation are intact Psych: euthymic mood, full affect   EKG:  EKG is not ordered today.     Recent Labs: 11/13/2021: TSH 3.04 06/20/2022: ALT 15; BUN 23; Creatinine, Ser 1.12; Hemoglobin 12.8; Magnesium 1.3; Platelets 208; Potassium 4.8; Sodium 141    Lipid Panel    Component Value Date/Time   CHOL 135 12/26/2021 0941   TRIG 111 12/26/2021 0941   HDL 45 12/26/2021 0941   CHOLHDL 3.0 12/26/2021  0941   LDLCALC 70 12/26/2021 0941      Wt Readings from Last 3 Encounters:  08/01/22 199 lb (90.3 kg)  06/20/22 200 lb 12.8 oz (91.1 kg)  02/06/22 197 lb (89.4 kg)      Other studies Reviewed: Additional studies/ records that were reviewed today include:   ADDENDUM REPORT: 01/09/2022 15:20   CLINICAL DATA:  61M with exertional shoulder pain   EXAM: Cardiac/Coronary CTA   TECHNIQUE: The patient was scanned on a [employer].   FINDINGS: A 100 kV prospective scan was triggered in the descending thoracic aorta at 111 HU's. Axial non-contrast 3 mm slices were carried out through the heart.  The data set was analyzed on a dedicated work station and scored using the [place]. Gantry rotation speed was 250 msecs and collimation was .6 mm. 0.8 mg of sl NTG was given. The 3D data set was reconstructed in 5% intervals of the 35-75% of the R-R cycle. Phases were analyzed on a dedicated work station using MPR, MIP and VRT modes. The patient received 80 cc of contrast.   Coronary Arteries:  Normal coronary origin.  Right dominance.   RCA is a large dominant artery that gives rise to PDA and PLA. Calcified plaque in the proximal RCA causes 50-69% stenosis. Mixed plaque in the mid RCA causes 70-99% stenosis. Calcified plaque in the distal RCA causes 25-49% stenosis.   Left main is a large artery that gives rise to LAD and LCX arteries.   LAD is a large vessel. Mixed plaque in the proximal LAD causes 50-69% stenosis.   LCX is a non-dominant artery that gives rise to one large OM1 branch.   Other findings:   Left Ventricle: Normal size   Left Atrium: Normal size. PFO   Pulmonary Veins: Normal configuration   Right Ventricle: Mild dilatation   Right Atrium: Normal size   Cardiac valves: Mild AV calcifications   Thoracic aorta: Normal size   Pulmonary Arteries: Normal size   Systemic Veins: Normal drainage   Pericardium: Normal thickness    IMPRESSION: 1. Coronary calcium score of 1129. This was 88th percentile for age and sex matched control.   2.  Normal coronary origin with right dominance.   3.  Obstructive CAD   4.  Mixed plaque in mid RCA causes severe (70-99%) stenosis   5. Mixed plaque in the proximal LAD causes moderate (50-69%) stenosis.   6.  Will send for CTFFR   CAD-RADS 4 Severe stenosis. (70-99% or > 50% left main). Cardiac catheterization or CT FFR is recommended. Consider symptom-guided anti-ischemic pharmacotherapy as well as risk factor modification per guideline directed care.     Electronically Signed   By: Oswaldo Milian M.D.   On: 01/09/2022 15:20    Addended by Donato Heinz, MD on 01/09/2022  3:23 PM   Study Result  Narrative & Impression  EXAM: OVER-READ INTERPRETATION  CT CHEST   The following report is an over-read performed by radiologist Dr. Dahlia Bailiff of [facility], [place] on 01/09/2022. This over-read does not include interpretation of cardiac or coronary anatomy or pathology. The coronary calcium score/coronary CTA interpretation by the cardiologist is attached.   COMPARISON:  None Available.   FINDINGS: Vascular: Aortic atherosclerosis. No acute noncardiac vascular finding.   Mediastinum/Nodes: No pathologically enlarged mediastinal or hilar lymph nodes within the visualized portions of the thorax. Distal esophagus is grossly unremarkable.   Lungs/Pleura: Right lower lobe calcified granuloma. Within the visualized portions of the thorax there are no suspicious pulmonary nodules or masses and no pleural effusion or pneumothorax. Hypoventilatory change in the dependent lungs.   Upper Abdomen: No acute abnormality.   Musculoskeletal: Thoracic spondylosis.   IMPRESSION: No acute noncardiac finding.   Aortic Atherosclerosis (ICD10-I70.0).   Electronically Signed: By: Dahlia Bailiff M.D. On: 01/09/2022 11:49    FFRCT ANALYSIS    FINDINGS: FFRct analysis was performed on the original cardiac CT angiogram dataset. Diagrammatic representation of the FFRct analysis is provided in a separate PDF document in PACS. This dictation was created using the PDF document and an interactive 3D model of the results. 3D model is not available in the EMR/PACS.  Normal FFR range is >0.80.   1. Left Main: No significant stenosis   2. LAD: CTFFR 0.76 across lesion in proximal LAD suggesting lesion is hemodynamically significant   3. LCX: No significant stenosis   4. RCA: CTFFR 0.68 across lesion in mid RCA, suggesting lesion is hemodynamically significant   IMPRESSION: 1.  CT FFR suggests obstructive CAD in proximal LAD and mid RCA   2.  Cardiac catheterization recommended     Electronically Signed   By: Oswaldo Milian M.D.   On: 01/10/2022 00:42   Cardiac cath/PCI 01/22/22: Procedures  CORONARY STENT INTERVENTION  LEFT HEART CATH AND CORONARY ANGIOGRAPHY   Conclusion      Prox LAD to Mid LAD lesion is 50% stenosed.   1st Diag lesion is 70% stenosed.   2nd Diag lesion is 70% stenosed.   Prox RCA to Mid RCA lesion is 60% stenosed.   Mid RCA lesion is 90% stenosed.   A drug-eluting stent was successfully placed using a SYNERGY XD [id number].   Post intervention, there is a 0% residual stenosis.   Post intervention, there is a 0% residual stenosis.   The left ventricular systolic function is normal.   LV end diastolic pressure is normal.   The left ventricular ejection fraction is 55-65% by visual estimate.   There is no mitral valve regurgitation.   Single vessel obstructive CAD Normal LV function Normal LVEDP Successful PCI of the mid RCA with DES x 1   Plan: the proximal LAD does not appear obstructive on Cath ( FFR abnormal on CTA). Will treat medically unless he has significant angina. Continue DAPT for at least 6 months. Anticipate same day DC.   Coronary Diagrams  Diagnostic Dominance:  Right Intervention  Implants   Event monitor 07/10/22:   Normal sinus rhythm   Few brief runs of SVT - 16 total. longest lasting 12 beat.   Otherwise rare ectopy   No Afib.     Patch Wear Time:  13 days and 20 hours (2023-10-21T11:50:23-0400 to 2023-11-04T08:36:20-0400)   Patient had a min HR of 50 bpm, max HR of 176 bpm, and avg HR of 73 bpm. Predominant underlying rhythm was Sinus Rhythm. 16 Supraventricular Tachycardia runs occurred, the run with the fastest interval lasting 4 beats with a max rate of 176 bpm, the  longest lasting 12 beats with an avg rate of 90 bpm. Isolated SVEs were rare (<1.0%), SVE Couplets were rare (<1.0%), and SVE Triplets were rare (<1.0%). Isolated VEs were rare (<1.0%, 74), VE Triplets were rare (<1.0%, 1), and no VE Couplets were  present.   ASSESSMENT AND PLAN:  1.  CAD s/p DES of the mid RCA in May. May discontinue Plavix at this point. LAD did not appear to be obstructive on cath. He is asymptomatic. Continue ASA and statin therapy.   2. Dysautonomia with significant orthostatic hypotension. Suspect this is primarily related to his diabetes. Need to consider amyloid but doubt. On no antihypertensives now.  Liberalize sodium in diet. Focus on maintaining good hydration, support hose. Sleep with some incline. We will check Echo to make sure no evidence of amyloid.   3. HLD. LDL 70. Now on high dose Crestor.   4. Renal insufficieny. Creatinine 1.41. improved to 1.23 off ACEi   Current medicines are reviewed at length with the patient today.  The patient does not have concerns regarding medicines.  The following changes have been made:  no change  Labs/ tests ordered today include:  Orders Placed This Encounter  Procedures   ECHOCARDIOGRAM COMPLETE         Disposition:   FU with me in 6 months  Signed, Eren Ryser Martinique, MD  08/01/2022 8:43 AM    [facility] [address], [place], [place], [address] Phone [phone],  Fax [phone]

## 2022-08-01 ENCOUNTER — Encounter: Payer: Self-pay | Admitting: Cardiology

## 2022-08-01 ENCOUNTER — Ambulatory Visit: Payer: Medicare HMO | Attending: Cardiology | Admitting: Cardiology

## 2022-08-01 VITALS — BP 130/68 | HR 88 | Ht 72.0 in | Wt 199.0 lb

## 2022-08-01 DIAGNOSIS — Z955 Presence of coronary angioplasty implant and graft: Secondary | ICD-10-CM | POA: Diagnosis not present

## 2022-08-01 DIAGNOSIS — I2510 Atherosclerotic heart disease of native coronary artery without angina pectoris: Secondary | ICD-10-CM

## 2022-08-01 DIAGNOSIS — E785 Hyperlipidemia, unspecified: Secondary | ICD-10-CM | POA: Diagnosis not present

## 2022-08-01 DIAGNOSIS — I951 Orthostatic hypotension: Secondary | ICD-10-CM | POA: Diagnosis not present

## 2022-08-01 NOTE — Patient Instructions (Signed)
Medication Instructions:  Continue same medications *If you need a refill on your cardiac medications before your next appointment, please call your pharmacy*   Lab Work: None ordered   Testing/Procedures: Echo   Follow-Up: At [facility], you and your health needs are our priority.  As part of our continuing mission to provide you with exceptional heart care, we have created designated Provider Care Teams.  These Care Teams include your primary Cardiologist (physician) and Advanced Practice Providers (APPs -  Physician Assistants and Nurse Practitioners) who all work together to provide you with the care you need, when you need it.  We recommend signing up for the patient portal called "MyChart".  Sign up information is provided on this After Visit Summary.  MyChart is used to connect with patients for Virtual Visits (Telemedicine).  Patients are able to view lab/test results, encounter notes, upcoming appointments, etc.  Non-urgent messages can be sent to your provider as well.   To learn more about what you can do with MyChart, go to [email or web address].    Your next appointment:  6 months   Call in Feb to schedule May appointment     The format for your next appointment: Office   Provider:  Dr.Jordan   Important Information About Sugar

## 2022-08-20 DIAGNOSIS — J00 Acute nasopharyngitis [common cold]: Secondary | ICD-10-CM | POA: Diagnosis not present

## 2022-08-20 DIAGNOSIS — Z20822 Contact with and (suspected) exposure to covid-19: Secondary | ICD-10-CM | POA: Diagnosis not present

## 2022-08-22 ENCOUNTER — Ambulatory Visit (HOSPITAL_COMMUNITY): Payer: Medicare HMO | Attending: Cardiology

## 2022-08-22 DIAGNOSIS — I951 Orthostatic hypotension: Secondary | ICD-10-CM

## 2022-08-22 DIAGNOSIS — E785 Hyperlipidemia, unspecified: Secondary | ICD-10-CM

## 2022-08-22 DIAGNOSIS — I2510 Atherosclerotic heart disease of native coronary artery without angina pectoris: Secondary | ICD-10-CM | POA: Diagnosis not present

## 2022-08-22 DIAGNOSIS — Z955 Presence of coronary angioplasty implant and graft: Secondary | ICD-10-CM | POA: Diagnosis not present

## 2022-08-22 LAB — ECHOCARDIOGRAM COMPLETE
Area-P 1/2: 3.19 cm2
S' Lateral: 2 cm

## 2022-09-11 ENCOUNTER — Encounter: Payer: Self-pay | Admitting: Internal Medicine

## 2022-09-11 ENCOUNTER — Ambulatory Visit: Payer: Medicare HMO | Admitting: Internal Medicine

## 2022-09-11 VITALS — BP 122/70 | HR 85 | Temp 97.6°F | Resp 18 | Ht 71.0 in | Wt 195.5 lb

## 2022-09-11 DIAGNOSIS — I25118 Atherosclerotic heart disease of native coronary artery with other forms of angina pectoris: Secondary | ICD-10-CM | POA: Diagnosis not present

## 2022-09-11 DIAGNOSIS — R42 Dizziness and giddiness: Secondary | ICD-10-CM | POA: Diagnosis not present

## 2022-09-11 DIAGNOSIS — E785 Hyperlipidemia, unspecified: Secondary | ICD-10-CM

## 2022-09-11 DIAGNOSIS — E1169 Type 2 diabetes mellitus with other specified complication: Secondary | ICD-10-CM | POA: Diagnosis not present

## 2022-09-11 NOTE — Progress Notes (Signed)
Office Visit  Subjective   Patient ID: Ivan Dickson   DOB: 1951/02/17   Age: 72 y.o.   MRN: [id number]   Chief Complaint Chief Complaint  Patient presents with   Follow-up    3 month follow up     History of Present Illness The patient is a 72 year old Caucasian/White male who returns for a follow-up visit for his diabetes. He says he has hard time monitoring his diet. He remains on glipizide 10 mg tablet, metformin ER 500 mg tablet,extended release 24 hr, pioglitazone 30 mg tablet, and and Ozempic 0.5mg  samples when able. He specifically denies chest pain, shortness of breath, unexplained fatigue, palpitations or racing heartbeats, syncopal episodes, unexplained abdominal pain, nausea or vomiting, and documented hypoglycemia. He checks blood sugars at least once per day and it was 130 mg/dl today. He came in fasting today in anticipation of lab work. His last HgbA1c was done approximately 3 months ago and was 8.4 mg/dl. The patient is a 72 year old Caucasian/White male, who presents for the follow-up evaluation of GERD. Since the last visit the patient is doing well with no new complaints. he continues on Pantoprazole which is effective for mgt.    He reports he has been doing well since his coronary artery stent placement in 12/2021 where he had been having severe dizziness and lightheadedness especially noted when working in his yard. His coronary CTA indicated severe disease in his mid  RCA and he was stented with DES. He had moderate disease in the diagonal branches  but his LAD was nonobstructive. He was placed on Plavix and Aspirin which he continues today with no s/s bleeding reported.  His Coronary calcium score was 1129    Past Medical History Past Medical History:  Diagnosis Date   Aortic atherosclerosis (HCC)    CAD (coronary artery disease)    s/p DES to mid RCA 12/2021   Diabetes mellitus without complication (HCC)    Dizziness    GERD (gastroesophageal reflux disease)     High cholesterol    History of colon polyps    HTN (hypertension)    Lightheadedness      Allergies Allergies  Allergen Reactions   Midodrine Other (See Comments)    Bloating and gas   Ciprofloxacin Itching and Rash     Review of Systems Review of Systems  Constitutional: Negative.   HENT: Negative.    Respiratory: Negative.    Cardiovascular: Negative.   Gastrointestinal: Negative.   Neurological: Negative.        Objective:    Vitals BP 122/70 (BP Location: Left Arm, Patient Position: Sitting, Cuff Size: Normal)   Pulse 85   Temp 97.6 F (36.4 C)   Resp 18   Ht 5\' 11"  (1.803 m)   Wt 195 lb 8 oz (88.7 kg)   SpO2 99%   BMI 27.27 kg/m    Physical Examination Physical Exam Constitutional:      Appearance: Normal appearance. He is normal weight.  HENT:     Head: Normocephalic and atraumatic.  Eyes:     Extraocular Movements: Extraocular movements intact.     Pupils: Pupils are equal, round, and reactive to light.  Cardiovascular:     Rate and Rhythm: Normal rate and regular rhythm.     Heart sounds: Normal heart sounds.  Pulmonary:     Effort: Pulmonary effort is normal.     Breath sounds: Normal breath sounds.  Abdominal:     General: Bowel  sounds are normal.     Palpations: Abdomen is soft.  Neurological:     General: No focal deficit present.     Mental Status: He is alert and oriented to person, place, and time.        Assessment & Plan:   Dyslipidemia associated with type 2 diabetes mellitus ([place]) Will repeat lipid panel and HbA1c today.  CAD (coronary artery disease) He has stent placed in June and will repeat lipid panel today.   Light headedness He will drink more water.  Hypomagnesemia He is on replacement therapy, I will repeat magnesium  level today.    Return in about 3 months (around 12/11/2022).   Garwin Brothers, MD

## 2022-09-11 NOTE — Assessment & Plan Note (Signed)
He will drink more water.

## 2022-09-11 NOTE — Assessment & Plan Note (Signed)
He is on replacement therapy, I will repeat magnesium  level today.

## 2022-09-11 NOTE — Assessment & Plan Note (Signed)
He has stent placed in June and will repeat lipid panel today.

## 2022-09-11 NOTE — Assessment & Plan Note (Signed)
Will repeat lipid panel and HbA1c today.

## 2022-09-13 LAB — HEMOGLOBIN A1C
Est. average glucose Bld gHb Est-mCnc: 209 mg/dL
Hgb A1c MFr Bld: 8.9 % — ABNORMAL HIGH (ref 4.8–5.6)

## 2022-09-13 LAB — LIPID PANEL
Chol/HDL Ratio: 2.4 ratio (ref 0.0–5.0)
Cholesterol, Total: 115 mg/dL (ref 100–199)
HDL: 47 mg/dL (ref >39)
LDL Chol Calc (NIH): 51 mg/dL (ref 0–99)
Triglycerides: 85 mg/dL (ref 0–149)
VLDL Cholesterol Cal: 17 mg/dL (ref 5–40)

## 2022-09-13 LAB — CMP14 + ANION GAP
ALT: 23 IU/L (ref 0–44)
AST: 20 IU/L (ref 0–40)
Albumin/Globulin Ratio: 1.6 (ref 1.2–2.2)
Albumin: 4.4 g/dL (ref 3.9–4.9)
Alkaline Phosphatase: 67 IU/L (ref 44–121)
Anion Gap: 15 mmol/L (ref 10.0–18.0)
BUN/Creatinine Ratio: 24 (ref 10–24)
BUN: 24 mg/dL (ref 8–27)
Bilirubin Total: 0.6 mg/dL (ref 0.0–1.2)
CO2: 22 mmol/L (ref 20–29)
Calcium: 9.3 mg/dL (ref 8.6–10.2)
Chloride: 102 mmol/L (ref 96–106)
Creatinine, Ser: 1.02 mg/dL (ref 0.76–1.27)
Globulin, Total: 2.8 g/dL (ref 1.5–4.5)
Glucose: 233 mg/dL — ABNORMAL HIGH (ref 70–99)
Potassium: 4.2 mmol/L (ref 3.5–5.2)
Sodium: 139 mmol/L (ref 134–144)
Total Protein: 7.2 g/dL (ref 6.0–8.5)
eGFR: 79 mL/min/{1.73_m2} (ref >59)

## 2022-09-13 LAB — MICROALBUMIN / CREATININE URINE RATIO
Creatinine, Urine: 182.9 mg/dL
Microalb/Creat Ratio: 57 mg/g creat — ABNORMAL HIGH (ref 0–29)
Microalbumin, Urine: 104.9 ug/mL

## 2022-09-13 LAB — MAGNESIUM: Magnesium: 1.3 mg/dL — ABNORMAL LOW (ref 1.6–2.3)

## 2022-10-01 DIAGNOSIS — Z7984 Long term (current) use of oral hypoglycemic drugs: Secondary | ICD-10-CM | POA: Diagnosis not present

## 2022-10-01 DIAGNOSIS — H25813 Combined forms of age-related cataract, bilateral: Secondary | ICD-10-CM | POA: Diagnosis not present

## 2022-10-01 DIAGNOSIS — H524 Presbyopia: Secondary | ICD-10-CM | POA: Diagnosis not present

## 2022-10-01 DIAGNOSIS — H5203 Hypermetropia, bilateral: Secondary | ICD-10-CM | POA: Diagnosis not present

## 2022-10-01 DIAGNOSIS — H52223 Regular astigmatism, bilateral: Secondary | ICD-10-CM | POA: Diagnosis not present

## 2022-10-01 DIAGNOSIS — E119 Type 2 diabetes mellitus without complications: Secondary | ICD-10-CM | POA: Diagnosis not present

## 2022-11-12 ENCOUNTER — Other Ambulatory Visit: Payer: Self-pay

## 2022-11-12 MED ORDER — PIOGLITAZONE HCL 30 MG PO TABS: 30.0000 mg | ORAL_TABLET | Freq: Every day | ORAL | 0 refills | Status: DC

## 2022-12-11 ENCOUNTER — Ambulatory Visit: Payer: Medicare HMO | Admitting: Internal Medicine

## 2022-12-13 ENCOUNTER — Encounter: Payer: Self-pay | Admitting: Internal Medicine

## 2022-12-13 ENCOUNTER — Ambulatory Visit: Payer: Medicare HMO | Admitting: Internal Medicine

## 2022-12-13 VITALS — BP 160/88 | HR 73 | Temp 97.3°F | Resp 18 | Ht 71.0 in | Wt 198.1 lb

## 2022-12-13 DIAGNOSIS — E785 Hyperlipidemia, unspecified: Secondary | ICD-10-CM | POA: Diagnosis not present

## 2022-12-13 DIAGNOSIS — I25118 Atherosclerotic heart disease of native coronary artery with other forms of angina pectoris: Secondary | ICD-10-CM | POA: Diagnosis not present

## 2022-12-13 DIAGNOSIS — E1169 Type 2 diabetes mellitus with other specified complication: Secondary | ICD-10-CM | POA: Diagnosis not present

## 2022-12-13 DIAGNOSIS — I10 Essential (primary) hypertension: Secondary | ICD-10-CM

## 2022-12-13 MED ORDER — METFORMIN HCL ER 500 MG PO TB24
1000.0000 mg | ORAL_TABLET | Freq: Two times a day (BID) | ORAL | 3 refills | Status: DC
Start: 2022-12-13 — End: 2023-01-10

## 2022-12-13 MED ORDER — LOSARTAN POTASSIUM 50 MG PO TABS: 50.0000 mg | ORAL_TABLET | Freq: Every day | ORAL | 3 refills | Status: DC

## 2022-12-13 MED ORDER — PIOGLITAZONE HCL 30 MG PO TABS: 30.0000 mg | ORAL_TABLET | Freq: Every day | ORAL | 4 refills | Status: DC

## 2022-12-13 MED ORDER — GLIPIZIDE 10 MG PO TABS
10.0000 mg | ORAL_TABLET | Freq: Two times a day (BID) | ORAL | 4 refills | Status: DC
Start: 2022-12-13 — End: 2023-01-10

## 2022-12-13 MED ORDER — OZEMPIC (1 MG/DOSE) 2 MG/1.5ML ~~LOC~~ SOPN: 1.0000 mg | PEN_INJECTOR | SUBCUTANEOUS | 3 refills | Status: DC

## 2022-12-13 NOTE — Progress Notes (Signed)
Office Visit  Subjective   Patient ID: Ivan Dickson   DOB: 12/11/1950   Age: 72 y.o.   MRN: [id number]   Chief Complaint No chief complaint on file.    History of Present Illness  The patient is a 72 year old Caucasian/White male who returns for a follow-up visit for his diabetes. He says he has hard time monitoring his diet. He remains on glipizide 10 mg tablet, metformin ER 500 mg tablet,extended release 24 hr, pioglitazone 30 mg tablet, and and Ozempic 0.5mg  samples when able. He specifically denies chest pain, shortness of breath, unexplained fatigue, palpitations or racing heartbeats, syncopal episodes, unexplained abdominal pain, nausea or vomiting, and documented hypoglycemia. He checks blood sugars at least once per day and it was 130 mg/dl today. He came in fasting today in anticipation of lab work. His last HgbA1c was done approximately 3 months ago and was 8.4 mg/dl. The patient is a 72 year old Caucasian/White male, who presents for the follow-up evaluation of GERD. Since the last visit the patient is doing well with no new complaints. he continues on Pantoprazole which is effective for mgt.     He reports he has been doing well since his coronary artery stent placement in 12/2021 where he had been having severe dizziness and lightheadedness especially noted when working in his yard. His coronary CTA indicated severe disease in his mid  RCA and he was stented with DES. He had moderate disease in the diagonal branches  but his LAD was nonobstructive. He was placed on Plavix and Aspirin which he continues today with no s/s bleeding reported.  His Coronary calcium score was 1129    Past Medical History Past Medical History:  Diagnosis Date   Aortic atherosclerosis    CAD (coronary artery disease)    s/p DES to mid RCA 12/2021   Diabetes mellitus without complication    Dizziness    GERD (gastroesophageal reflux disease)    High cholesterol    History of colon polyps    HTN  (hypertension)    Lightheadedness      Allergies Allergies  Allergen Reactions   Midodrine Other (See Comments)    Bloating and gas   Ciprofloxacin Itching and Rash     Review of Systems Review of Systems  Constitutional: Negative.   HENT: Negative.    Respiratory: Negative.    Cardiovascular: Negative.   Gastrointestinal: Negative.   Neurological: Negative.        Objective:    Vitals BP (!) 160/88 (BP Location: Left Arm, Patient Position: Sitting, Cuff Size: Normal)   Pulse 73   Temp (!) 97.3 F (36.3 C)   Resp 18   Ht 5\' 11"  (1.803 m)   Wt 198 lb 2 oz (89.9 kg)   SpO2 99%   BMI 27.63 kg/m    Physical Examination Physical Exam Constitutional:      Appearance: Normal appearance.  HENT:     Head: Normocephalic and atraumatic.  Cardiovascular:     Rate and Rhythm: Normal rate and regular rhythm.     Heart sounds: Normal heart sounds.  Pulmonary:     Effort: Pulmonary effort is normal.     Breath sounds: Normal breath sounds.  Abdominal:     General: Bowel sounds are normal.     Palpations: Abdomen is soft.  Neurological:     General: No focal deficit present.     Mental Status: He is alert and oriented to person, place, and time.  Assessment & Plan:   HTN (hypertension) Will start him on losartan 50 mg daily  Dyslipidemia associated with type 2 diabetes mellitus (HCC) His sugar been high but LDL is target control. I will increase ozempic to 1 mg q weekly  Hypomagnesemia He take magnesium replacement.   CAD (coronary artery disease) He will continue to follow with cardiologist.    Return in about 1 month (around 01/12/2023).   Eloisa Northern, MD

## 2022-12-13 NOTE — Assessment & Plan Note (Signed)
He take magnesium replacement.

## 2022-12-13 NOTE — Assessment & Plan Note (Signed)
His sugar been high but LDL is target control. I will increase ozempic to 1 mg q weekly

## 2022-12-13 NOTE — Assessment & Plan Note (Signed)
Will start him on losartan 50 mg daily

## 2022-12-13 NOTE — Assessment & Plan Note (Signed)
He will continue to follow with cardiologist. 

## 2022-12-13 NOTE — Addendum Note (Signed)
Addended byEloisa Northern on: 12/13/2022 12:25 PM   Modules accepted: Orders

## 2022-12-17 ENCOUNTER — Ambulatory Visit: Payer: Medicare HMO

## 2022-12-17 DIAGNOSIS — E1169 Type 2 diabetes mellitus with other specified complication: Secondary | ICD-10-CM

## 2022-12-17 DIAGNOSIS — R42 Dizziness and giddiness: Secondary | ICD-10-CM

## 2022-12-17 DIAGNOSIS — E785 Hyperlipidemia, unspecified: Secondary | ICD-10-CM | POA: Diagnosis not present

## 2022-12-17 NOTE — Progress Notes (Signed)
Lab draw for Magnesium, CMP, lipid And A1C

## 2022-12-18 LAB — LIPID PANEL
Chol/HDL Ratio: 6.5 ratio — ABNORMAL HIGH (ref 0.0–5.0)
Cholesterol, Total: 281 mg/dL — ABNORMAL HIGH (ref 100–199)
HDL: 43 mg/dL (ref >39)
LDL Chol Calc (NIH): 218 mg/dL — ABNORMAL HIGH (ref 0–99)
Triglycerides: 114 mg/dL (ref 0–149)
VLDL Cholesterol Cal: 20 mg/dL (ref 5–40)

## 2022-12-18 LAB — CMP14 + ANION GAP
ALT: 27 IU/L (ref 0–44)
AST: 18 IU/L (ref 0–40)
Albumin/Globulin Ratio: 1.4 (ref 1.2–2.2)
Albumin: 4.2 g/dL (ref 3.9–4.9)
Alkaline Phosphatase: 76 IU/L (ref 44–121)
Anion Gap: 17 mmol/L (ref 10.0–18.0)
BUN/Creatinine Ratio: 20 (ref 10–24)
BUN: 30 mg/dL — ABNORMAL HIGH (ref 8–27)
Bilirubin Total: 0.7 mg/dL (ref 0.0–1.2)
CO2: 19 mmol/L — ABNORMAL LOW (ref 20–29)
Calcium: 10.2 mg/dL (ref 8.6–10.2)
Chloride: 101 mmol/L (ref 96–106)
Creatinine, Ser: 1.5 mg/dL — ABNORMAL HIGH (ref 0.76–1.27)
Globulin, Total: 2.9 g/dL (ref 1.5–4.5)
Glucose: 222 mg/dL — ABNORMAL HIGH (ref 70–99)
Potassium: 5.3 mmol/L — ABNORMAL HIGH (ref 3.5–5.2)
Sodium: 137 mmol/L (ref 134–144)
Total Protein: 7.1 g/dL (ref 6.0–8.5)
eGFR: 50 mL/min/{1.73_m2} — ABNORMAL LOW (ref >59)

## 2022-12-18 LAB — MAGNESIUM: Magnesium: 1.6 mg/dL (ref 1.6–2.3)

## 2022-12-18 LAB — HEMOGLOBIN A1C
Est. average glucose Bld gHb Est-mCnc: 220 mg/dL
Hgb A1c MFr Bld: 9.3 % — ABNORMAL HIGH (ref 4.8–5.6)

## 2022-12-20 ENCOUNTER — Encounter: Payer: Self-pay | Admitting: Internal Medicine

## 2022-12-20 ENCOUNTER — Ambulatory Visit: Payer: Medicare HMO | Admitting: Internal Medicine

## 2022-12-20 VITALS — BP 132/82 | HR 75 | Temp 97.2°F | Ht 71.0 in | Wt 192.4 lb

## 2022-12-20 DIAGNOSIS — E782 Mixed hyperlipidemia: Secondary | ICD-10-CM

## 2022-12-20 DIAGNOSIS — M542 Cervicalgia: Secondary | ICD-10-CM | POA: Insufficient documentation

## 2022-12-20 NOTE — Progress Notes (Signed)
Pt is coming in office today will have amin discuss then.

## 2022-12-20 NOTE — Progress Notes (Signed)
   Office Visit  Subjective   Patient ID: Ivan Dickson   DOB: 07-27-1951   Age: 72 y.o.   MRN: [id number]   Chief Complaint Chief Complaint  Patient presents with   office visit    Neuro referral      History of Present Illness HPI   Past Medical History Past Medical History:  Diagnosis Date   Aortic atherosclerosis    CAD (coronary artery disease)    s/p DES to mid RCA 12/2021   Diabetes mellitus without complication    Dizziness    GERD (gastroesophageal reflux disease)    High cholesterol    History of colon polyps    HTN (hypertension)    Lightheadedness      Allergies Allergies  Allergen Reactions   Midodrine Other (See Comments)    Bloating and gas   Ciprofloxacin Itching and Rash     Review of Systems ROS     Objective:    Vitals BP 132/82 (BP Location: Left Arm, Patient Position: Sitting, Cuff Size: Normal)   Pulse 75   Temp (!) 97.2 F (36.2 C)   Ht  (1.803 m)   Wt 192 lb 6 oz (87.3 kg)   SpO2 98%   BMI 26.83 kg/m    Physical Examination Physical Exam     Assessment & Plan:   Hyperlipidemia He take rosuvastatin 40 mg and his LDL has been under controlled. But his LDL last weak for above 200. He says he is watching his diet and take same medications. Will Give CGM and will repeat lipid panel   No follow-ups on file.   Eloisa Northern, MD

## 2022-12-20 NOTE — Assessment & Plan Note (Signed)
He take rosuvastatin 40 mg and his LDL has been under controlled. But his LDL last weak for above 200. He says he is watching his diet and take same medications. Will Give CGM and will repeat lipid panel

## 2022-12-21 LAB — LIPID PANEL
Chol/HDL Ratio: 6.2 ratio — ABNORMAL HIGH (ref 0.0–5.0)
Cholesterol, Total: 241 mg/dL — ABNORMAL HIGH (ref 100–199)
HDL: 39 mg/dL — ABNORMAL LOW (ref >39)
LDL Chol Calc (NIH): 186 mg/dL — ABNORMAL HIGH (ref 0–99)
Triglycerides: 91 mg/dL (ref 0–149)
VLDL Cholesterol Cal: 16 mg/dL (ref 5–40)

## 2022-12-23 ENCOUNTER — Ambulatory Visit: Payer: Medicare HMO | Admitting: Internal Medicine

## 2022-12-23 DIAGNOSIS — I10 Essential (primary) hypertension: Secondary | ICD-10-CM

## 2022-12-24 NOTE — Progress Notes (Signed)
Patient called.  Patient aware. Patient is still taking Rosuvastatin .

## 2023-01-10 ENCOUNTER — Ambulatory Visit: Payer: Medicare HMO | Admitting: Internal Medicine

## 2023-01-10 ENCOUNTER — Encounter: Payer: Self-pay | Admitting: Internal Medicine

## 2023-01-10 VITALS — BP 128/78 | HR 80 | Temp 97.3°F | Resp 18 | Ht 71.0 in | Wt 191.0 lb

## 2023-01-10 DIAGNOSIS — M542 Cervicalgia: Secondary | ICD-10-CM | POA: Diagnosis not present

## 2023-01-10 DIAGNOSIS — E1169 Type 2 diabetes mellitus with other specified complication: Secondary | ICD-10-CM | POA: Diagnosis not present

## 2023-01-10 DIAGNOSIS — E785 Hyperlipidemia, unspecified: Secondary | ICD-10-CM | POA: Diagnosis not present

## 2023-01-10 MED ORDER — REPATHA 140 MG/ML ~~LOC~~ SOSY: 140.0000 mg | PREFILLED_SYRINGE | SUBCUTANEOUS | 6 refills | Status: DC

## 2023-01-10 MED ORDER — INSULIN DEGLUDEC 100 UNIT/ML ~~LOC~~ SOLN: 10.0000 [IU] | Freq: Every day | SUBCUTANEOUS | 6 refills | Status: DC

## 2023-01-10 MED ORDER — SYNJARDY 12.5-1000 MG PO TABS: 2.0000 | ORAL_TABLET | Freq: Every day | ORAL | 6 refills | Status: DC

## 2023-01-10 NOTE — Progress Notes (Addendum)
Office Visit  Subjective   Patient ID: Ivan Dickson   DOB: [birth year]   Age: 72 y.o.   MRN: [id number]   Chief Complaint Chief Complaint  Patient presents with   Follow-up    Mix hyperlipidemia     History of Present Illness The patient is a 72 year old Caucasian/White male who returns for a follow-up visit for his diabetes. He says he has hard time monitoring his diet. He remains on glipizide 10 mg tablet, metformin ER 500 mg tablet,extended release 24 hr, pioglitazone 30 mg tablet, and and Ozempic 0.5mg  samples when able. He specifically denies chest pain, shortness of breath, unexplained fatigue, palpitations or racing heartbeats, syncopal episodes, unexplained abdominal pain, nausea or vomiting, and documented hypoglycemia. He checks blood sugars at least once per day and it was 130 mg/dl today. He came in fasting today in anticipation of lab work. His last HgbA1c was done approximately 3 months ago and was 8.4 mg/dl. The patient is a 72 year old Caucasian/White male, who presents for the follow-up evaluation of GERD. Since the last visit the patient is doing well with no new complaints. he continues on Pantoprazole which is effective for mgt.     He reports he has been doing well since his coronary artery stent placement in 12/2021 where he had been having severe dizziness and lightheadedness especially noted when working in his yard. His coronary CTA indicated severe disease in his mid  RCA and he was stented with DES. He had moderate disease in the diagonal branches  but his LAD was nonobstructive. He was placed on Plavix and Aspirin which he continues today with no s/s bleeding reported.  His Coronary calcium score was 1129    Past Medical History Past Medical History:  Diagnosis Date   Aortic atherosclerosis (HCC)    CAD (coronary artery disease)    s/p DES to mid RCA 12/2021   Diabetes mellitus without complication (HCC)    Dizziness    GERD (gastroesophageal reflux disease)     High cholesterol    History of colon polyps    HTN (hypertension)    Lightheadedness      Allergies Allergies  Allergen Reactions   Midodrine Other (See Comments)    Bloating and gas   Ciprofloxacin Itching and Rash     Review of Systems ROS     Objective:    Vitals BP 128/78 (BP Location: Left Arm, Patient Position: Sitting, Cuff Size: Normal)   Pulse 80   Temp (!) 97.3 F (36.3 C)   Resp 18   Ht 5\' 11"  (1.803 m)   Wt 191 lb (86.6 kg)   SpO2 93%   BMI 26.64 kg/m    Physical Examination Physical Exam Constitutional:      Appearance: Normal appearance.  Cardiovascular:     Rate and Rhythm: Normal rate and regular rhythm.     Heart sounds: Normal heart sounds.  Pulmonary:     Effort: Pulmonary effort is normal.     Breath sounds: Normal breath sounds.  Abdominal:     General: Bowel sounds are normal.     Palpations: Abdomen is soft.  Neurological:     General: No focal deficit present.     Mental Status: He is alert and oriented to person, place, and time.        Assessment & Plan:   Dyslipidemia associated with type 2 diabetes mellitus (HCC) His sugar is very high and LDL is still very high 189  while he take rosuvastatin 40 mg daily. I will start repatha injection. I will also stop ozympic because of GI symptoms. I will star latus and he will keep increasing the dose of latus 3 units every 3rd day for fasting sugar greater than 150.   Neck pain on left side I will refer him to see Dr. 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   Eloisa Northern, MD

## 2023-01-10 NOTE — Assessment & Plan Note (Signed)
I will refer him to see Dr. 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

## 2023-01-10 NOTE — Assessment & Plan Note (Addendum)
His sugar is very high and LDL is still very high 189 while he take rosuvastatin 40 mg daily. I will start repatha injection. I will also stop ozympic because of GI symptoms. I will star latus and he will keep increasing the dose of latus 3 units every 3rd day for fasting sugar greater than 150.

## 2023-01-14 ENCOUNTER — Other Ambulatory Visit: Payer: Self-pay

## 2023-01-14 MED ORDER — BD PEN NEEDLE NANO 2ND GEN 32G X 4 MM MISC
6 refills | Status: DC
Start: 2023-01-14 — End: 2024-03-24

## 2023-01-15 ENCOUNTER — Other Ambulatory Visit: Payer: Self-pay

## 2023-01-15 MED ORDER — EVOLOCUMAB 140 MG/ML ~~LOC~~ SOSY: PREFILLED_SYRINGE | SUBCUTANEOUS | 6 refills | Status: DC

## 2023-01-17 NOTE — Progress Notes (Unsigned)
labs

## 2023-01-24 NOTE — Progress Notes (Unsigned)
Cardiology Office Note   Date:  08/01/2022   ID:  Ivan Dickson, DOB Apr 04, 1951, MRN [id number]  PCP:  Eloisa Northern, MD  Cardiologist:   Tikisha Molinaro [place], MD   Chief Complaint  Patient presents with   Follow-up    6 weeks.   Coronary Artery Disease      History of Present Illness: Ivan Dickson is a 72 y.o. male who is seen for follow up CAD. Has history of HLD and DM type 2.   He reports over past year that he has symptoms of lightheadedness and dizziness when out working in the yard. Worse after squatting or bending over. Noted BP as low as 60/40. Was on lisinopril for renal protection but this has been discontinued. No real change in symptoms. When this happens he stops and rests. No true syncope.  Does complain of exertional pain in neck and shoulders. Did see a cardiologist a couple of years ago in [place]. Apparently had stress test, Echo, and Zio patch monitor then. Is very concerned about CV risk given risk factors of DM, HLD, and strong family history of CAD.   Subsequent coronary CTA indicated significant obstructive disease. He underwent cardiac cath demonstrating severe disease in the mid RCA that was successfully stented with DES. There was moderate disease in the diagonal branches but the LAD appeared nonobstructive (abnormal FFR on CT). He was treated with ASA and Plavix. Crestor increased to 40 mg. Lisinopril discontinued due to dizziness. His renal parameters improved off ACEi.  In October he was seen by Sharlene Dory NP for orthostatic hypotension while at cardiac Rehab. Intolerant of midodrine so was started on Florinef. Event monitor ordered showing brief runs of SVT otherwise benign. Patient reports Florinef resulted in elevated BP so he quit taking it. He notes that he still has some orthostatic symptoms but he has been able to manage this. We did perform Echo which was normal. Nothing to suggest amyloid.   He denies any chest pain or dyspnea. No palpitations. He is  caring for his wife who had to have surgery for a fractured ankle. He is doing odd jobs around the house. Noted recent marked increase in lipids. States he was taking Crestor. Now on Repatha- just received first dose. Did note some mild myalgias on Crestor so no longer taking.   Past Medical History:  Diagnosis Date   Aortic atherosclerosis (HCC)    CAD (coronary artery disease)    s/p DES to mid RCA 12/2021   Diabetes mellitus without complication (HCC)    Dizziness    GERD (gastroesophageal reflux disease)    High cholesterol    History of colon polyps    HTN (hypertension)    Lightheadedness     Past Surgical History:  Procedure Laterality Date   APPENDECTOMY     COLONOSCOPY     found polyps over 10 years ago   CORONARY STENT INTERVENTION N/A 01/22/2022   Procedure: CORONARY STENT INTERVENTION;  Surgeon: [place], Ray Gervasi M, MD;  Location: [facility] INVASIVE CV [facility];  Service: Cardiovascular;  Laterality: N/A;   ESOPHAGOGASTRODUODENOSCOPY     over 10 years ago thinks he did   HAND SURGERY Right    LEFT HEART CATH AND CORONARY ANGIOGRAPHY N/A 01/22/2022   Procedure: LEFT HEART CATH AND CORONARY ANGIOGRAPHY;  Surgeon: [place], Mandi Mattioli M, MD;  Location: [facility] INVASIVE CV LAB;  Service: Cardiovascular;  Laterality: N/A;     Current Outpatient Medications  Medication Sig Dispense Refill   aspirin EC 81  MG tablet Take 1 tablet (81 mg total) by mouth daily. Swallow whole. 90 tablet 3   Blood Glucose Monitoring Suppl (TRUE METRIX METER) w/Device KIT See admin instructions.     glipiZIDE (GLUCOTROL) 10 MG tablet Take 10 mg by mouth 2 (two) times daily before a meal.     Magnesium 400 MG CAPS Take 400 mg by mouth daily.     metFORMIN (GLUCOPHAGE-XR) 500 MG 24 hr tablet Take 1,000 mg by mouth 2 (two) times daily.     nitroGLYCERIN (NITROSTAT) 0.4 MG SL tablet Place 1 tablet (0.4 mg total) under the tongue every 5 (five) minutes as needed. 25 tablet 12   OZEMPIC, 0.25 OR 0.5 MG/DOSE, 2 MG/1.5ML SOPN Inject  0.5 mg into the skin every Friday.     pantoprazole (PROTONIX) 40 MG tablet Take 40 mg by mouth 2 (two) times daily.     pioglitazone (ACTOS) 30 MG tablet Take 30 mg by mouth daily.     rosuvastatin (CRESTOR) 40 MG tablet Take 1 tablet (40 mg total) by mouth daily. 30 tablet 6   TRUE METRIX BLOOD GLUCOSE TEST test strip daily.     No current facility-administered medications for this visit.    Allergies:   Midodrine and Ciprofloxacin    Social History:  The patient  reports that he has never smoked. He has never used smokeless tobacco. He reports current alcohol use. He reports that he does not currently use drugs.   Family History:  The patient's family history includes Arrhythmia in his sister; Breast cancer in his sister; Diabetes in his mother; Heart attack in his brother; Heart disease in his brother and mother; Heart failure in his son; Hyperlipidemia in his mother; Hypertension in his mother; Kidney cancer in his sister; Stroke in his father.    ROS:  Please see the history of present illness.   Otherwise, review of systems are positive for none.   All other systems are reviewed and negative.    PHYSICAL EXAM: VS:  BP 130/68 (BP Location: Left Arm, Patient Position: Sitting, Cuff Size: Normal)   Pulse 88   Ht 6' (1.829 m)   Wt 199 lb (90.3 kg)   BMI 26.99 kg/m  , BMI Body mass index is 26.99 kg/m. Gen: Well nourished, well developed, in no acute distress HEENT: normal Neck: no JVD, carotid bruits, or masses Cardiac: RRR; no murmurs, rubs, or gallops,no edema. No radial site hematoma Respiratory:  clear to auscultation bilaterally, normal work of breathing GI: soft, nontender, nondistended, + BS MS: no deformity or atrophy Skin: warm and dry, no rash Neuro:  Strength and sensation are intact Psych: euthymic mood, full affect   EKG:  EKG is ordered today. NSR rate 70. Normal. I have personally reviewed and interpreted this study.   Recent Labs: 11/13/2021: TSH  3.04 06/20/2022: ALT 15; BUN 23; Creatinine, Ser 1.12; Hemoglobin 12.8; Magnesium 1.3; Platelets 208; Potassium 4.8; Sodium 141    Lipid Panel    Component Value Date/Time   CHOL 135 12/26/2021 0941   TRIG 111 12/26/2021 0941   HDL 45 12/26/2021 0941   CHOLHDL 3.0 12/26/2021 0941   LDLCALC 70 12/26/2021 0941      Wt Readings from Last 3 Encounters:  08/01/22 199 lb (90.3 kg)  06/20/22 200 lb 12.8 oz (91.1 kg)  02/06/22 197 lb (89.4 kg)      Other studies Reviewed: Additional studies/ records that were reviewed today include:   ADDENDUM REPORT: 01/09/2022 15:20  CLINICAL DATA:  74M with exertional shoulder pain   EXAM: Cardiac/Coronary CTA   TECHNIQUE: The patient was scanned on a [employer].   FINDINGS: A 100 kV prospective scan was triggered in the descending thoracic aorta at 111 HU's. Axial non-contrast 3 mm slices were carried out through the heart. The data set was analyzed on a dedicated work station and scored using the Agatson method. Gantry rotation speed was 250 msecs and collimation was .6 mm. 0.8 mg of sl NTG was given. The 3D data set was reconstructed in 5% intervals of the 35-75% of the R-R cycle. Phases were analyzed on a dedicated work station using MPR, MIP and VRT modes. The patient received 80 cc of contrast.   Coronary Arteries:  Normal coronary origin.  Right dominance.   RCA is a large dominant artery that gives rise to PDA and PLA. Calcified plaque in the proximal RCA causes 50-69% stenosis. Mixed plaque in the mid RCA causes 70-99% stenosis. Calcified plaque in the distal RCA causes 25-49% stenosis.   Left main is a large artery that gives rise to LAD and LCX arteries.   LAD is a large vessel. Mixed plaque in the proximal LAD causes 50-69% stenosis.   LCX is a non-dominant artery that gives rise to one large OM1 branch.   Other findings:   Left Ventricle: Normal size   Left Atrium: Normal size. PFO   Pulmonary  Veins: Normal configuration   Right Ventricle: Mild dilatation   Right Atrium: Normal size   Cardiac valves: Mild AV calcifications   Thoracic aorta: Normal size   Pulmonary Arteries: Normal size   Systemic Veins: Normal drainage   Pericardium: Normal thickness   IMPRESSION: 1. Coronary calcium score of 1129. This was 88th percentile for age and sex matched control.   2.  Normal coronary origin with right dominance.   3.  Obstructive CAD   4.  Mixed plaque in mid RCA causes severe (70-99%) stenosis   5. Mixed plaque in the proximal LAD causes moderate (50-69%) stenosis.   6.  Will send for CTFFR   CAD-RADS 4 Severe stenosis. (70-99% or > 50% left main). Cardiac catheterization or CT FFR is recommended. Consider symptom-guided anti-ischemic pharmacotherapy as well as risk factor modification per guideline directed care.     Electronically Signed   By: Epifanio Lesches M.D.   On: 01/09/2022 15:20    Addended by Little Ishikawa, MD on 01/09/2022  3:23 PM   Study Result  Narrative & Impression  EXAM: OVER-READ INTERPRETATION  CT CHEST   The following report is an over-read performed by radiologist Dr. Maudry Mayhew of [facility] on 01/09/2022. This over-read does not include interpretation of cardiac or coronary anatomy or pathology. The coronary calcium score/coronary CTA interpretation by the cardiologist is attached.   COMPARISON:  None Available.   FINDINGS: Vascular: Aortic atherosclerosis. No acute noncardiac vascular finding.   Mediastinum/Nodes: No pathologically enlarged mediastinal or hilar lymph nodes within the visualized portions of the thorax. Distal esophagus is grossly unremarkable.   Lungs/Pleura: Right lower lobe calcified granuloma. Within the visualized portions of the thorax there are no suspicious pulmonary nodules or masses and no pleural effusion or pneumothorax. Hypoventilatory change in the dependent  lungs.   Upper Abdomen: No acute abnormality.   Musculoskeletal: Thoracic spondylosis.   IMPRESSION: No acute noncardiac finding.   Aortic Atherosclerosis (ICD10-I70.0).   Electronically Signed: By: Maudry Mayhew M.D. On: 01/09/2022 11:49    FFRCT  ANALYSIS   FINDINGS: FFRct analysis was performed on the original cardiac CT angiogram dataset. Diagrammatic representation of the FFRct analysis is provided in a separate PDF document in PACS. This dictation was created using the PDF document and an interactive 3D model of the results. 3D model is not available in the EMR/PACS. Normal FFR range is >0.80.   1. Left Main: No significant stenosis   2. LAD: CTFFR 0.76 across lesion in proximal LAD suggesting lesion is hemodynamically significant   3. LCX: No significant stenosis   4. RCA: CTFFR 0.68 across lesion in mid RCA, suggesting lesion is hemodynamically significant   IMPRESSION: 1.  CT FFR suggests obstructive CAD in proximal LAD and mid RCA   2.  Cardiac catheterization recommended     Electronically Signed   By: Epifanio Lesches M.D.   On: 01/10/2022 00:42   Cardiac cath/PCI 01/22/22: Procedures  CORONARY STENT INTERVENTION  LEFT HEART CATH AND CORONARY ANGIOGRAPHY   Conclusion      Prox LAD to Mid LAD lesion is 50% stenosed.   1st Diag lesion is 70% stenosed.   2nd Diag lesion is 70% stenosed.   Prox RCA to Mid RCA lesion is 60% stenosed.   Mid RCA lesion is 90% stenosed.   A drug-eluting stent was successfully placed using a SYNERGY XD [id number].   Post intervention, there is a 0% residual stenosis.   Post intervention, there is a 0% residual stenosis.   The left ventricular systolic function is normal.   LV end diastolic pressure is normal.   The left ventricular ejection fraction is 55-65% by visual estimate.   There is no mitral valve regurgitation.   Single vessel obstructive CAD Normal LV function Normal LVEDP Successful PCI of the mid  RCA with DES x 1   Plan: the proximal LAD does not appear obstructive on Cath ( FFR abnormal on CTA). Will treat medically unless he has significant angina. Continue DAPT for at least 6 months. Anticipate same day DC.   Coronary Diagrams  Diagnostic Dominance: Right Intervention  Implants   Event monitor 07/10/22:   Normal sinus rhythm   Few brief runs of SVT - 16 total. longest lasting 12 beat.   Otherwise rare ectopy   No Afib.     Patch Wear Time:  13 days and 20 hours (2023-10-21T11:50:23-0400 to 2023-11-04T08:36:20-0400)   Patient had a min HR of 50 bpm, max HR of 176 bpm, and avg HR of 73 bpm. Predominant underlying rhythm was Sinus Rhythm. 16 Supraventricular Tachycardia runs occurred, the run with the fastest interval lasting 4 beats with a max rate of 176 bpm, the  longest lasting 12 beats with an avg rate of 90 bpm. Isolated SVEs were rare (<1.0%), SVE Couplets were rare (<1.0%), and SVE Triplets were rare (<1.0%). Isolated VEs were rare (<1.0%, 74), VE Triplets were rare (<1.0%, 1), and no VE Couplets were  present.   Echo 08/22/22: IMPRESSIONS     1. Left ventricular ejection fraction, by estimation, is 65 to 70%. The  left ventricle has normal function. The left ventricle has no regional  wall motion abnormalities. There is mild concentric left ventricular  hypertrophy. Left ventricular diastolic  parameters are consistent with Grade I diastolic dysfunction (impaired  relaxation).   2. Right ventricular systolic function is normal. The right ventricular  size is normal. There is normal pulmonary artery systolic pressure.   3. Left atrial size was moderately dilated.   4. The mitral valve is normal  in structure. Trivial mitral valve  regurgitation. No evidence of mitral stenosis.   5. The aortic valve is tricuspid. Aortic valve regurgitation is not  visualized. No aortic stenosis is present.   6. The inferior vena cava is normal in size with greater than 50%   respiratory variability, suggesting right atrial pressure of 3 mmHg.   ASSESSMENT AND PLAN:  1.  CAD s/p DES of the mid RCA in May 2023.   LAD did not appear to be obstructive on cath. He is asymptomatic. Continue ASA and focus on optimizing lipid and diabetic control   2. Dysautonomia with significant orthostatic hypotension. Suspect this is related to his diabetes. No evidence of amyloid on Echo.  On no antihypertensives now.  Liberalize sodium in diet. Focus on maintaining good hydration, support hose. .   3. Hyperlipidemia. Did have fairly good control on Crestor but recent labs showed marked increase in LDL. Now on Repatha. Follow up labs with Dr Nelson Chimes. If not at goal may need to resume Crestor at lower dose. Would consider checking lipoprotein A with next lab draw.   4. Renal insufficieny. Creatinine 1.41. improved off ACEi but recent creatinine up to 1.5.    Current medicines are reviewed at length with the patient today.  The patient does not have concerns regarding medicines.  The following changes have been made:  no change  L   Disposition:   FU with me in 6 months  Signed, Nocole Zammit [place], MD  08/01/2022 8:43 AM    [facility] [address], [place], [place], [address] Phone [phone], Fax [phone]

## 2023-01-28 DIAGNOSIS — M5412 Radiculopathy, cervical region: Secondary | ICD-10-CM | POA: Diagnosis not present

## 2023-01-28 DIAGNOSIS — G5620 Lesion of ulnar nerve, unspecified upper limb: Secondary | ICD-10-CM | POA: Diagnosis not present

## 2023-01-30 ENCOUNTER — Ambulatory Visit: Payer: Medicare HMO | Attending: Cardiology | Admitting: Cardiology

## 2023-01-30 ENCOUNTER — Encounter: Payer: Self-pay | Admitting: Cardiology

## 2023-01-30 VITALS — BP 122/66 | HR 70 | Ht 71.0 in | Wt 189.2 lb

## 2023-01-30 DIAGNOSIS — I951 Orthostatic hypotension: Secondary | ICD-10-CM | POA: Diagnosis not present

## 2023-01-30 DIAGNOSIS — I2510 Atherosclerotic heart disease of native coronary artery without angina pectoris: Secondary | ICD-10-CM

## 2023-01-30 DIAGNOSIS — E785 Hyperlipidemia, unspecified: Secondary | ICD-10-CM

## 2023-01-30 NOTE — Patient Instructions (Signed)
Medication Instructions:  Continue same medications *If you need a refill on your cardiac medications before your next appointment, please call your pharmacy*   Lab Work: None ordered    Testing/Procedures: None ordered   Follow-Up: At [facility], you and your health needs are our priority.  As part of our continuing mission to provide you with exceptional heart care, we have created designated Provider Care Teams.  These Care Teams include your primary Cardiologist (physician) and Advanced Practice Providers (APPs -  Physician Assistants and Nurse Practitioners) who all work together to provide you with the care you need, when you need it.  We recommend signing up for the patient portal called "MyChart".  Sign up information is provided on this After Visit Summary.  MyChart is used to connect with patients for Virtual Visits (Telemedicine).  Patients are able to view lab/test results, encounter notes, upcoming appointments, etc.  Non-urgent messages can be sent to your provider as well.   To learn more about what you can do with MyChart, go to [email or web address].    Your next appointment:  6 months    Provider:  Dr.Jordan   

## 2023-02-04 DIAGNOSIS — M5412 Radiculopathy, cervical region: Secondary | ICD-10-CM | POA: Diagnosis not present

## 2023-02-04 DIAGNOSIS — R200 Anesthesia of skin: Secondary | ICD-10-CM | POA: Diagnosis not present

## 2023-02-07 ENCOUNTER — Ambulatory Visit: Payer: Medicare HMO | Admitting: Internal Medicine

## 2023-02-11 ENCOUNTER — Other Ambulatory Visit: Payer: Self-pay

## 2023-02-11 MED ORDER — DEXCOM G7 SENSOR MISC: 6 refills | Status: DC

## 2023-02-12 ENCOUNTER — Ambulatory Visit: Payer: Medicare HMO | Admitting: Internal Medicine

## 2023-02-12 ENCOUNTER — Encounter: Payer: Self-pay | Admitting: Internal Medicine

## 2023-02-12 VITALS — BP 118/70 | HR 69 | Temp 97.7°F | Resp 18 | Ht 71.0 in | Wt 188.0 lb

## 2023-02-12 DIAGNOSIS — I25118 Atherosclerotic heart disease of native coronary artery with other forms of angina pectoris: Secondary | ICD-10-CM | POA: Diagnosis not present

## 2023-02-12 DIAGNOSIS — M542 Cervicalgia: Secondary | ICD-10-CM

## 2023-02-12 DIAGNOSIS — E1169 Type 2 diabetes mellitus with other specified complication: Secondary | ICD-10-CM

## 2023-02-12 DIAGNOSIS — E785 Hyperlipidemia, unspecified: Secondary | ICD-10-CM | POA: Diagnosis not present

## 2023-02-12 MED ORDER — ROSUVASTATIN CALCIUM 40 MG PO TABS: 40.0000 mg | ORAL_TABLET | Freq: Every day | ORAL | 4 refills | Status: DC

## 2023-02-12 MED ORDER — PREGABALIN 100 MG PO CAPS: 100.0000 mg | ORAL_CAPSULE | Freq: Two times a day (BID) | ORAL | 5 refills | Status: DC

## 2023-02-12 MED ORDER — OZEMPIC (0.25 OR 0.5 MG/DOSE) 2 MG/1.5ML ~~LOC~~ SOPN: 0.5000 mg | PEN_INJECTOR | SUBCUTANEOUS | 3 refills | Status: DC

## 2023-02-12 NOTE — Assessment & Plan Note (Signed)
Stable, he has stent placed and has non obstructing CAD. Goal is to keep his LDL below 70.

## 2023-02-12 NOTE — Addendum Note (Signed)
Addended byEloisa Northern on: 02/12/2023 09:24 AM   Modules accepted: Orders

## 2023-02-12 NOTE — Assessment & Plan Note (Signed)
He has MRI neck and is schedule to have nerve conduction studay.

## 2023-02-12 NOTE — Progress Notes (Signed)
Office Visit  Subjective   Patient ID: Ivan Dickson   DOB: 28-Nov-1950   Age: 72 y.o.   MRN: [id number]   Chief Complaint Chief Complaint  Patient presents with   Follow-up    Dyslipidemia associated with type 2 diabetes     History of Present Illness The patient is a 72 year old Caucasian/White male who returns for a follow-up.   He has CGM and his average blood sugar been in normal range 74% of the time, he says his fasting sugar stay between 102 to 130 with no hypoglycemia.He take  synjardy and tresiba 12 units daily. He also take pioglitazone 30 mg tablet, and he is not taking ozempic but is willing to take it. He specifically denies chest pain, shortness of breath, unexplained fatigue, palpitations or racing heartbeats, syncopal episodes, unexplained abdominal pain, nausea or vomiting, and documented hypoglycemia. He checks blood sugars at least once per day and it was 130 mg/dl today. He came in fasting today in anticipation of lab work. His last HgbA1c was done approximately 3 months ago and was 8.4 mg/dl. The patient is a 72 year old Caucasian/White male, who presents for the follow-up evaluation of GERD. Since the last visit the patient is doing well with no new complaints. he continues on Pantoprazole which is effective for mgt.     He reports he has been doing well since his coronary artery stent placement in 12/2021 where he had been having severe dizziness and lightheadedness especially noted when working in his yard. His coronary CTA indicated severe disease in his mid  RCA and he was stented with DES. He had moderate disease in the diagonal branches  but his LAD was nonobstructive. He was placed on Plavix and Aspirin which he continues today with no s/s bleeding reported.  His Coronary calcium score was 1129    Past Medical History Past Medical History:  Diagnosis Date   Aortic atherosclerosis (HCC)    CAD (coronary artery disease)    s/p DES to mid RCA 12/2021   Diabetes  mellitus without complication (HCC)    Dizziness    GERD (gastroesophageal reflux disease)    High cholesterol    History of colon polyps    HTN (hypertension)    Lightheadedness      Allergies Allergies  Allergen Reactions   Midodrine Other (See Comments)    Bloating and gas   Ciprofloxacin Itching and Rash     Review of Systems Review of Systems  Constitutional: Negative.   Respiratory: Negative.    Cardiovascular: Negative.   Gastrointestinal: Negative.   Neurological:  Positive for tingling.       Left ulnar nerve distribution       Objective:    Vitals BP 118/70 (BP Location: Left Arm, Patient Position: Sitting, Cuff Size: Normal)   Pulse 69   Temp 97.7 F (36.5 C)   Resp 18   Ht 5\' 11"  (1.803 m)   Wt 188 lb (85.3 kg)   SpO2 90%   BMI 26.22 kg/m    Physical Examination Physical Exam Constitutional:      Appearance: Normal appearance.  HENT:     Head: Normocephalic and atraumatic.  Cardiovascular:     Rate and Rhythm: Normal rate and regular rhythm.     Heart sounds: Normal heart sounds.  Pulmonary:     Effort: Pulmonary effort is normal.     Breath sounds: Normal breath sounds.  Abdominal:     General: Bowel sounds  are normal.     Palpations: Abdomen is soft.  Neurological:     General: No focal deficit present.     Mental Status: He is alert and oriented to person, place, and time.        Assessment & Plan:   Dyslipidemia associated with type 2 diabetes mellitus (HCC) I will repeat labs today and will add ozempic and wean off tresiba, he will also restart rosuvastatin.  Neck pain on left side He has MRI neck and is schedule to have nerve conduction studay.  CAD (coronary artery disease) Stable, he has stent placed and has non obstructing CAD. Goal is to keep his LDL below 70.    Return in about 3 months (around 05/15/2023).   Eloisa Northern, MD

## 2023-02-12 NOTE — Assessment & Plan Note (Signed)
I will repeat labs today and will add ozempic and wean off tresiba, he will also restart rosuvastatin.

## 2023-02-13 LAB — CMP14 + ANION GAP
ALT: 17 IU/L (ref 0–44)
AST: 21 IU/L (ref 0–40)
Albumin/Globulin Ratio: 1.8
Albumin: 4.4 g/dL (ref 3.9–4.9)
Alkaline Phosphatase: 63 IU/L (ref 44–121)
Anion Gap: 14 mmol/L (ref 10.0–18.0)
BUN/Creatinine Ratio: 21 (ref 10–24)
BUN: 28 mg/dL — ABNORMAL HIGH (ref 8–27)
Bilirubin Total: 0.5 mg/dL (ref 0.0–1.2)
CO2: 22 mmol/L (ref 20–29)
Calcium: 9.6 mg/dL (ref 8.6–10.2)
Chloride: 107 mmol/L — ABNORMAL HIGH (ref 96–106)
Creatinine, Ser: 1.34 mg/dL — ABNORMAL HIGH (ref 0.76–1.27)
Globulin, Total: 2.5 g/dL (ref 1.5–4.5)
Glucose: 131 mg/dL — ABNORMAL HIGH (ref 70–99)
Potassium: 5.3 mmol/L — ABNORMAL HIGH (ref 3.5–5.2)
Sodium: 143 mmol/L (ref 134–144)
Total Protein: 6.9 g/dL (ref 6.0–8.5)
eGFR: 57 mL/min/{1.73_m2} — ABNORMAL LOW (ref >59)

## 2023-02-13 LAB — HEMOGLOBIN A1C
Est. average glucose Bld gHb Est-mCnc: 160 mg/dL
Hgb A1c MFr Bld: 7.2 % — ABNORMAL HIGH (ref 4.8–5.6)

## 2023-02-13 LAB — MICROALBUMIN / CREATININE URINE RATIO
Creatinine, Urine: 98.8 mg/dL
Microalb/Creat Ratio: 7 mg/g creat (ref 0–29)
Microalbumin, Urine: 7 ug/mL

## 2023-02-13 LAB — LIPID PANEL
Chol/HDL Ratio: 3 ratio (ref 0.0–5.0)
Cholesterol, Total: 164 mg/dL (ref 100–199)
HDL: 55 mg/dL (ref >39)
LDL Chol Calc (NIH): 96 mg/dL (ref 0–99)
Triglycerides: 67 mg/dL (ref 0–149)
VLDL Cholesterol Cal: 13 mg/dL (ref 5–40)

## 2023-02-17 NOTE — Progress Notes (Signed)
Patient called.  Patient aware.  

## 2023-03-21 DIAGNOSIS — G5622 Lesion of ulnar nerve, left upper limb: Secondary | ICD-10-CM | POA: Diagnosis not present

## 2023-03-25 DIAGNOSIS — G5620 Lesion of ulnar nerve, unspecified upper limb: Secondary | ICD-10-CM | POA: Diagnosis not present

## 2023-04-22 DIAGNOSIS — M25561 Pain in right knee: Secondary | ICD-10-CM | POA: Diagnosis not present

## 2023-05-14 ENCOUNTER — Encounter: Payer: Self-pay | Admitting: Internal Medicine

## 2023-05-14 ENCOUNTER — Ambulatory Visit: Payer: Medicare HMO | Admitting: Internal Medicine

## 2023-05-14 VITALS — BP 122/74 | HR 64 | Temp 97.4°F | Resp 18 | Ht 71.0 in | Wt 194.0 lb

## 2023-05-14 DIAGNOSIS — E785 Hyperlipidemia, unspecified: Secondary | ICD-10-CM | POA: Diagnosis not present

## 2023-05-14 DIAGNOSIS — Z0000 Encounter for general adult medical examination without abnormal findings: Secondary | ICD-10-CM

## 2023-05-14 DIAGNOSIS — I25118 Atherosclerotic heart disease of native coronary artery with other forms of angina pectoris: Secondary | ICD-10-CM

## 2023-05-14 DIAGNOSIS — E1169 Type 2 diabetes mellitus with other specified complication: Secondary | ICD-10-CM | POA: Diagnosis not present

## 2023-05-14 DIAGNOSIS — G479 Sleep disorder, unspecified: Secondary | ICD-10-CM

## 2023-05-14 DIAGNOSIS — I10 Essential (primary) hypertension: Secondary | ICD-10-CM | POA: Diagnosis not present

## 2023-05-14 DIAGNOSIS — Z6827 Body mass index (BMI) 27.0-27.9, adult: Secondary | ICD-10-CM | POA: Diagnosis not present

## 2023-05-14 DIAGNOSIS — Z125 Encounter for screening for malignant neoplasm of prostate: Secondary | ICD-10-CM

## 2023-05-14 NOTE — Assessment & Plan Note (Signed)
I will do sleep study

## 2023-05-14 NOTE — Assessment & Plan Note (Signed)
Well controlled 

## 2023-05-14 NOTE — Progress Notes (Addendum)
Office Visit  Subjective   Patient ID: Ever Colombe   DOB: Jul 04, 1951   Age: 72 y.o.   MRN: [id number]   Chief Complaint Chief Complaint  Patient presents with   Annual Exam    Medicare awv     History of Present Illness 72 years old male is here for annual wellness examination.  He says that he drive without any problem.  He is independent of all activities of daily living except that he has a numbness on the medial left 3 fingers for that he is going to have the surgery done and also he has a pain and swelling of his right knee. He has seen orthopedic surgeon for right knee pain and swelling and steroid injection was given and his pain is much better. He will also has medial nerve release surgery of left elbow at the end of this month.  He has no fall.   He scored 30 out of 30 on Mini-Mental status examination.  I have reviewed his activities of daily living, fall risk assessment and depression screening.  He has diabetes mellitus and his last hemoglobin A1c was 7.2, three months ago. He has seen eye doctor this January 24 and no diabetic changes were noted. He has CGM and blood sugar has been good.  His urine albumin screening was also -68-month ago.  He does take [place] 10 units daily, Synjardy and Actos 30 mg daily.  He has CAD s/p stent placed in the past.  He does not have any chest pain or exertional dyspnea.  He follows with cardiologist once a year.   He also has dyslipidemia where LDL was not controlled in spite of being on rosuvastatin 40 mg daily.  I have started him on Repatha and his LDL 3 months ago was below 100.  He is also watching his diet and do regular exercise.  He says that he is very restless at nighttime and toss and turn.  His wife did complain to him about that.  He says that when he wakes up he feels fresh and ready to go.  He has sleep study done when he was young and that was normal.  He get flu shot every year, He has pneumonia vaccine after the age of 50.  He never has shingle vaccine and is not interested in taking it. He has two COVID vaccine.   He denies any urinary complaint. He has colonoscopy few years ago and no polyp were removed.  He is 72 years old and he will not need any further screening after the age of 69.  His mother has diabetes, heart problem and father died of stroke and sister died of renal cell carcinoma. His brother also has parkinsonism.   Past Medical History Past Medical History:  Diagnosis Date   Aortic atherosclerosis (HCC)    CAD (coronary artery disease)    s/p DES to mid RCA 12/2021   Diabetes mellitus without complication (HCC)    Dizziness    GERD (gastroesophageal reflux disease)    High cholesterol    History of colon polyps    HTN (hypertension)    Lightheadedness      Allergies Allergies  Allergen Reactions   Midodrine Other (See Comments)    Bloating and gas   Ciprofloxacin Itching and Rash     Review of Systems Review of Systems  Constitutional: Negative.   HENT: Negative.    Respiratory: Negative.    Cardiovascular: Negative.   Gastrointestinal: Negative.  Neurological: Negative.        Objective:    Vitals BP 122/74 (BP Location: Left Arm, Patient Position: Sitting, Cuff Size: Normal)   Pulse 64   Temp (!) 97.4 F (36.3 C)   Resp 18   Ht 5\' 11"  (1.803 m)   Wt 194 lb (88 kg)   SpO2 94%   BMI 27.06 kg/m    Physical Examination Physical Exam Constitutional:      Appearance: Normal appearance.  HENT:     Head: Normocephalic and atraumatic.  Eyes:     Extraocular Movements: Extraocular movements intact.     Pupils: Pupils are equal, round, and reactive to light.  Pulmonary:     Effort: Pulmonary effort is normal.     Breath sounds: Normal breath sounds.  Abdominal:     General: Bowel sounds are normal.     Palpations: Abdomen is soft.  Neurological:     Mental Status: He is alert.        Assessment & Plan:   Dyslipidemia associated with type 2 diabetes  mellitus (HCC) His sugar been controlled, his LDL is also below 100 on repatha and rosuvastatin 40 mg daily  HTN (hypertension) Well controlled  CAD (coronary artery disease) S/p post angioplasty and stent placed 5/23.  Screening for prostate cancer Will do PSA  Restless sleeper I will do sleep study    Return in about 3 months (around 08/13/2023).   Eloisa Northern, MD

## 2023-05-14 NOTE — Assessment & Plan Note (Signed)
S/p post angioplasty and stent placed 5/23.

## 2023-05-14 NOTE — Assessment & Plan Note (Signed)
His sugar been controlled, his LDL is also below 100 on repatha and rosuvastatin 40 mg daily

## 2023-05-14 NOTE — Addendum Note (Signed)
Addended byEloisa Northern on: 05/14/2023 09:58 AM   Modules accepted: Orders

## 2023-05-14 NOTE — Assessment & Plan Note (Signed)
Will do PSA.  

## 2023-05-15 LAB — CMP14 + ANION GAP
ALT: 20 IU/L (ref 0–44)
AST: 19 IU/L (ref 0–40)
Albumin: 4.1 g/dL (ref 3.8–4.8)
Alkaline Phosphatase: 82 IU/L (ref 44–121)
Anion Gap: 15 mmol/L (ref 10.0–18.0)
BUN/Creatinine Ratio: 21 (ref 10–24)
BUN: 24 mg/dL (ref 8–27)
Bilirubin Total: 0.4 mg/dL (ref 0.0–1.2)
CO2: 21 mmol/L (ref 20–29)
Calcium: 9.4 mg/dL (ref 8.6–10.2)
Chloride: 107 mmol/L — ABNORMAL HIGH (ref 96–106)
Creatinine, Ser: 1.17 mg/dL (ref 0.76–1.27)
Globulin, Total: 2.8 g/dL (ref 1.5–4.5)
Glucose: 106 mg/dL — ABNORMAL HIGH (ref 70–99)
Potassium: 4.6 mmol/L (ref 3.5–5.2)
Sodium: 143 mmol/L (ref 134–144)
Total Protein: 6.9 g/dL (ref 6.0–8.5)
eGFR: 67 mL/min/{1.73_m2} (ref >59)

## 2023-05-15 LAB — PSA: Prostate Specific Ag, Serum: 0.5 ng/mL (ref 0.0–4.0)

## 2023-05-15 LAB — HEMOGLOBIN A1C

## 2023-05-16 NOTE — Progress Notes (Signed)
Patient called.  Patient aware.

## 2023-05-20 DIAGNOSIS — G5620 Lesion of ulnar nerve, unspecified upper limb: Secondary | ICD-10-CM | POA: Diagnosis not present

## 2023-05-26 DIAGNOSIS — R053 Chronic cough: Secondary | ICD-10-CM | POA: Diagnosis not present

## 2023-05-26 DIAGNOSIS — J310 Chronic rhinitis: Secondary | ICD-10-CM | POA: Diagnosis not present

## 2023-05-26 DIAGNOSIS — G4733 Obstructive sleep apnea (adult) (pediatric): Secondary | ICD-10-CM | POA: Diagnosis not present

## 2023-05-28 DIAGNOSIS — G4733 Obstructive sleep apnea (adult) (pediatric): Secondary | ICD-10-CM | POA: Diagnosis not present

## 2023-05-28 DIAGNOSIS — R053 Chronic cough: Secondary | ICD-10-CM | POA: Diagnosis not present

## 2023-06-02 DIAGNOSIS — K219 Gastro-esophageal reflux disease without esophagitis: Secondary | ICD-10-CM | POA: Diagnosis not present

## 2023-06-02 DIAGNOSIS — Z955 Presence of coronary angioplasty implant and graft: Secondary | ICD-10-CM | POA: Diagnosis not present

## 2023-06-02 DIAGNOSIS — I10 Essential (primary) hypertension: Secondary | ICD-10-CM | POA: Diagnosis not present

## 2023-06-02 DIAGNOSIS — G5622 Lesion of ulnar nerve, left upper limb: Secondary | ICD-10-CM | POA: Diagnosis not present

## 2023-06-02 DIAGNOSIS — Z7982 Long term (current) use of aspirin: Secondary | ICD-10-CM | POA: Diagnosis not present

## 2023-06-02 DIAGNOSIS — E1169 Type 2 diabetes mellitus with other specified complication: Secondary | ICD-10-CM | POA: Diagnosis not present

## 2023-06-02 DIAGNOSIS — Z794 Long term (current) use of insulin: Secondary | ICD-10-CM | POA: Diagnosis not present

## 2023-06-02 DIAGNOSIS — I2510 Atherosclerotic heart disease of native coronary artery without angina pectoris: Secondary | ICD-10-CM | POA: Diagnosis not present

## 2023-06-02 DIAGNOSIS — Z79899 Other long term (current) drug therapy: Secondary | ICD-10-CM | POA: Diagnosis not present

## 2023-06-09 DIAGNOSIS — R053 Chronic cough: Secondary | ICD-10-CM | POA: Diagnosis not present

## 2023-06-09 DIAGNOSIS — E611 Iron deficiency: Secondary | ICD-10-CM | POA: Diagnosis not present

## 2023-06-09 DIAGNOSIS — R790 Abnormal level of blood mineral: Secondary | ICD-10-CM | POA: Diagnosis not present

## 2023-06-09 DIAGNOSIS — J310 Chronic rhinitis: Secondary | ICD-10-CM | POA: Diagnosis not present

## 2023-06-09 DIAGNOSIS — D649 Anemia, unspecified: Secondary | ICD-10-CM | POA: Diagnosis not present

## 2023-06-09 DIAGNOSIS — G4761 Periodic limb movement disorder: Secondary | ICD-10-CM | POA: Diagnosis not present

## 2023-06-09 DIAGNOSIS — G4733 Obstructive sleep apnea (adult) (pediatric): Secondary | ICD-10-CM | POA: Diagnosis not present

## 2023-07-01 ENCOUNTER — Telehealth: Payer: Self-pay

## 2023-07-01 NOTE — Telephone Encounter (Signed)
Patient Name: Ivan Dickson  DOB: 09-26-1950 MRN: [id number]  Primary Cardiologist: Peter [place], MD  Chart reviewed as part of pre-operative protocol coverage.  Simple dental extractions (i.e. 1-2 teeth) are considered low risk procedures per guidelines and generally do not require any specific cardiac clearance. It is also generally accepted that for simple extractions and dental cleanings, there is no need to interrupt blood thinner therapy.  SBE prophylaxis is not required for the patient from a cardiac standpoint.  I will route this recommendation to the requesting party via Epic fax function and remove from pre-op pool.  Please call with questions.  Napoleon Form, Leodis Rains, NP 07/01/2023, 5:01 PM

## 2023-07-01 NOTE — Telephone Encounter (Signed)
Pre-operative Risk Assessment    Patient Name: Ivan Dickson  DOB: [birth year] MRN: [id number]  Date of last visit: 01/30/23 Dr. Peter [place] Date of next visit: 08/05/23 Dr. Peter [place]   Request for Surgical Clearance    Procedure:   Simple Cleaning  Date of Surgery:  Clearance TBD                                 Surgeon:  Mosetta Pigeon, DDS Surgeon's Group or Practice Name:  [facility] Phone number:  [phone] Fax number:  [phone]   Type of Clearance Requested:   - Medical  - Pharmacy:  Hold dental office asking if patient needs pre med      Type of Anesthesia:  Not Indicated   Additional requests/questions:    Signed, Jahlisa Rossitto Vernia Teem   07/01/2023, 4:13 PM

## 2023-07-09 DIAGNOSIS — G479 Sleep disorder, unspecified: Secondary | ICD-10-CM | POA: Diagnosis not present

## 2023-07-09 DIAGNOSIS — G4733 Obstructive sleep apnea (adult) (pediatric): Secondary | ICD-10-CM | POA: Diagnosis not present

## 2023-07-09 DIAGNOSIS — R053 Chronic cough: Secondary | ICD-10-CM | POA: Diagnosis not present

## 2023-07-14 DIAGNOSIS — J310 Chronic rhinitis: Secondary | ICD-10-CM | POA: Diagnosis not present

## 2023-07-14 DIAGNOSIS — R053 Chronic cough: Secondary | ICD-10-CM | POA: Diagnosis not present

## 2023-07-14 DIAGNOSIS — G4761 Periodic limb movement disorder: Secondary | ICD-10-CM | POA: Diagnosis not present

## 2023-07-29 ENCOUNTER — Other Ambulatory Visit: Payer: Self-pay

## 2023-07-30 NOTE — Progress Notes (Signed)
Cardiology Office Note   Date:  08/05/2023   ID:  Ivan Dickson, DOB April 18, 1951, MRN [id number]  PCP:  Eloisa Northern, MD  Cardiologist:   Loc Feinstein [place], MD   Chief Complaint  Patient presents with   Coronary Artery Disease      History of Present Illness: Ivan Dickson is a 72 y.o. male who is seen for follow up CAD. Has history of HLD and DM type 2.   He reports over past year that he has symptoms of lightheadedness and dizziness when out working in the yard. Worse after squatting or bending over. Noted BP as low as 60/40. Was on lisinopril for renal protection but this has been discontinued. No real change in symptoms. When this happens he stops and rests. No true syncope.  Does complain of exertional pain in neck and shoulders. Did see a cardiologist a couple of years ago in [place]. Apparently had stress test, Echo, and Zio patch monitor then. Is very concerned about CV risk given risk factors of DM, HLD, and strong family history of CAD.   Subsequent coronary CTA indicated significant obstructive disease. He underwent cardiac cath demonstrating severe disease in the mid RCA that was successfully stented with DES. There was moderate disease in the diagonal branches but the LAD appeared nonobstructive (abnormal FFR on CT). He was treated with ASA and Plavix. Crestor increased to 40 mg. Lisinopril discontinued due to dizziness. His renal parameters improved off ACEi.  In October he was seen by Sharlene Dory NP for orthostatic hypotension while at cardiac Rehab. Intolerant of midodrine so was started on Florinef. Event monitor ordered showing brief runs of SVT otherwise benign. Patient reports Florinef resulted in elevated BP so he quit taking it. He notes that he still has some orthostatic symptoms but he has been able to manage this. We did perform Echo which was normal. Nothing to suggest amyloid.   He denies any chest pain or dyspnea. No palpitations. Denies any dizziness. He is staying  active.  Past Medical History:  Diagnosis Date   Aortic atherosclerosis (HCC)    CAD (coronary artery disease)    s/p DES to mid RCA 12/2021   Diabetes mellitus without complication (HCC)    Dizziness    GERD (gastroesophageal reflux disease)    High cholesterol    History of colon polyps    HTN (hypertension)    Lightheadedness     Past Surgical History:  Procedure Laterality Date   APPENDECTOMY     COLONOSCOPY     found polyps over 10 years ago   CORONARY STENT INTERVENTION N/A 01/22/2022   Procedure: CORONARY STENT INTERVENTION;  Surgeon: [place], Brannon Levene M, MD;  Location: [facility] INVASIVE CV LAB;  Service: Cardiovascular;  Laterality: N/A;   ESOPHAGOGASTRODUODENOSCOPY     over 10 years ago thinks he did   HAND SURGERY Right    LEFT HEART CATH AND CORONARY ANGIOGRAPHY N/A 01/22/2022   Procedure: LEFT HEART CATH AND CORONARY ANGIOGRAPHY;  Surgeon: [place], Daveah Varone M, MD;  Location: [facility] INVASIVE CV LAB;  Service: Cardiovascular;  Laterality: N/A;     Current Outpatient Medications  Medication Sig Dispense Refill   aspirin EC 81 MG tablet Take 1 tablet (81 mg total) by mouth daily. Swallow whole. 90 tablet 3   Blood Glucose Monitoring Suppl (TRUE METRIX METER) w/Device KIT See admin instructions.     Continuous Glucose Sensor (DEXCOM G7 SENSOR) MISC Use to check blood glucose levels. Replace sensor every 10 days. 3 each  6   Empagliflozin-metFORMIN HCl ER (SYNJARDY XR) 12.12-998 MG TB24 Take 2 tablets by mouth daily in the afternoon.     Evolocumab 140 MG/ML SOSY Inject 140mg  into the skin every 14 days 2.1 mL 6   fluticasone (FLONASE) 50 MCG/ACT nasal spray Place into both nostrils.     Insulin Degludec (TRESIBA) 100 UNIT/ML SOLN Inject 10 Units into the skin daily in the afternoon. 2 mL 6   Insulin Pen Needle (BD PEN NEEDLE NANO 2ND GEN) 32G X 4 MM MISC Use when taking the tresiba 10 units daily 100 each 6   Magnesium 400 MG CAPS Take 400 mg by mouth daily.     pantoprazole (PROTONIX) 40  MG tablet Take 40 mg by mouth 2 (two) times daily.     pioglitazone (ACTOS) 30 MG tablet Take 30 mg by mouth daily.     pregabalin (LYRICA) 100 MG capsule Take 1 capsule (100 mg total) by mouth 2 (two) times daily. 60 capsule 5   rosuvastatin (CRESTOR) 40 MG tablet Take 1 tablet (40 mg total) by mouth daily. 90 tablet 4   TRUE METRIX BLOOD GLUCOSE TEST test strip daily.     No current facility-administered medications for this visit.    Allergies:   Midodrine and Ciprofloxacin    Social History:  The patient  reports that he has never smoked. He has never used smokeless tobacco. He reports current alcohol use. He reports that he does not currently use drugs.   Family History:  The patient's family history includes Arrhythmia in his sister; Breast cancer in his sister; Diabetes in his mother; Heart attack in his brother; Heart disease in his brother and mother; Heart failure in his son; Hyperlipidemia in his mother; Hypertension in his mother; Kidney cancer in his sister; Stroke in his father.    ROS:  Please see the history of present illness.   Otherwise, review of systems are positive for none.   All other systems are reviewed and negative.    PHYSICAL EXAM: VS:  BP (!) 170/80 (BP Location: Left Arm, Cuff Size: Normal)   Pulse 67   Ht 5\' 11"  (1.803 m)   Wt 208 lb (94.3 kg)   SpO2 98%   BMI 29.01 kg/m  , BMI Body mass index is 29.01 kg/m. Gen: Well nourished, well developed, in no acute distress HEENT: normal Neck: no JVD, carotid bruits, or masses Cardiac: RRR; no murmurs, rubs, or gallops,no edema. No radial site hematoma Respiratory:  clear to auscultation bilaterally, normal work of breathing GI: soft, nontender, nondistended, + BS MS: no deformity or atrophy Skin: warm and dry, no rash Neuro:  Strength and sensation are intact Psych: euthymic mood, full affect   EKG:  EKG is not ordered today.    Recent Labs: 12/17/2022: Magnesium 1.6 05/14/2023: ALT 20; BUN 24;  Creatinine, Ser 1.17; Potassium 4.6; Sodium 143    Lipid Panel    Component Value Date/Time   CHOL 164 02/12/2023 0926   TRIG 67 02/12/2023 0926   HDL 55 02/12/2023 0926   CHOLHDL 3.0 02/12/2023 0926   LDLCALC 96 02/12/2023 0926      Wt Readings from Last 3 Encounters:  08/05/23 208 lb (94.3 kg)  05/14/23 194 lb (88 kg)  02/12/23 188 lb (85.3 kg)      Other studies Reviewed: Additional studies/ records that were reviewed today include:   ADDENDUM REPORT: 01/09/2022 15:20   CLINICAL DATA:  9M with exertional shoulder pain   EXAM:  Cardiac/Coronary CTA   TECHNIQUE: The patient was scanned on a [employer].   FINDINGS: A 100 kV prospective scan was triggered in the descending thoracic aorta at 111 HU's. Axial non-contrast 3 mm slices were carried out through the heart. The data set was analyzed on a dedicated work station and scored using the Agatson method. Gantry rotation speed was 250 msecs and collimation was .6 mm. 0.8 mg of sl NTG was given. The 3D data set was reconstructed in 5% intervals of the 35-75% of the R-R cycle. Phases were analyzed on a dedicated work station using MPR, MIP and VRT modes. The patient received 80 cc of contrast.   Coronary Arteries:  Normal coronary origin.  Right dominance.   RCA is a large dominant artery that gives rise to PDA and PLA. Calcified plaque in the proximal RCA causes 50-69% stenosis. Mixed plaque in the mid RCA causes 70-99% stenosis. Calcified plaque in the distal RCA causes 25-49% stenosis.   Left main is a large artery that gives rise to LAD and LCX arteries.   LAD is a large vessel. Mixed plaque in the proximal LAD causes 50-69% stenosis.   LCX is a non-dominant artery that gives rise to one large OM1 branch.   Other findings:   Left Ventricle: Normal size   Left Atrium: Normal size. PFO   Pulmonary Veins: Normal configuration   Right Ventricle: Mild dilatation   Right Atrium: Normal  size   Cardiac valves: Mild AV calcifications   Thoracic aorta: Normal size   Pulmonary Arteries: Normal size   Systemic Veins: Normal drainage   Pericardium: Normal thickness   IMPRESSION: 1. Coronary calcium score of 1129. This was 88th percentile for age and sex matched control.   2.  Normal coronary origin with right dominance.   3.  Obstructive CAD   4.  Mixed plaque in mid RCA causes severe (70-99%) stenosis   5. Mixed plaque in the proximal LAD causes moderate (50-69%) stenosis.   6.  Will send for CTFFR   CAD-RADS 4 Severe stenosis. (70-99% or > 50% left main). Cardiac catheterization or CT FFR is recommended. Consider symptom-guided anti-ischemic pharmacotherapy as well as risk factor modification per guideline directed care.     Electronically Signed   By: Epifanio Lesches M.D.   On: 01/09/2022 15:20    Addended by Little Ishikawa, MD on 01/09/2022  3:23 PM   Study Result  Narrative & Impression  EXAM: OVER-READ INTERPRETATION  CT CHEST   The following report is an over-read performed by radiologist Dr. Maudry Mayhew of [facility] on 01/09/2022. This over-read does not include interpretation of cardiac or coronary anatomy or pathology. The coronary calcium score/coronary CTA interpretation by the cardiologist is attached.   COMPARISON:  None Available.   FINDINGS: Vascular: Aortic atherosclerosis. No acute noncardiac vascular finding.   Mediastinum/Nodes: No pathologically enlarged mediastinal or hilar lymph nodes within the visualized portions of the thorax. Distal esophagus is grossly unremarkable.   Lungs/Pleura: Right lower lobe calcified granuloma. Within the visualized portions of the thorax there are no suspicious pulmonary nodules or masses and no pleural effusion or pneumothorax. Hypoventilatory change in the dependent lungs.   Upper Abdomen: No acute abnormality.   Musculoskeletal: Thoracic spondylosis.    IMPRESSION: No acute noncardiac finding.   Aortic Atherosclerosis (ICD10-I70.0).   Electronically Signed: By: Maudry Mayhew M.D. On: 01/09/2022 11:49    FFRCT ANALYSIS   FINDINGS: FFRct analysis was performed on the original  cardiac CT angiogram dataset. Diagrammatic representation of the FFRct analysis is provided in a separate PDF document in PACS. This dictation was created using the PDF document and an interactive 3D model of the results. 3D model is not available in the EMR/PACS. Normal FFR range is >0.80.   1. Left Main: No significant stenosis   2. LAD: CTFFR 0.76 across lesion in proximal LAD suggesting lesion is hemodynamically significant   3. LCX: No significant stenosis   4. RCA: CTFFR 0.68 across lesion in mid RCA, suggesting lesion is hemodynamically significant   IMPRESSION: 1.  CT FFR suggests obstructive CAD in proximal LAD and mid RCA   2.  Cardiac catheterization recommended     Electronically Signed   By: Epifanio Lesches M.D.   On: 01/10/2022 00:42   Cardiac cath/PCI 01/22/22: Procedures  CORONARY STENT INTERVENTION  LEFT HEART CATH AND CORONARY ANGIOGRAPHY   Conclusion      Prox LAD to Mid LAD lesion is 50% stenosed.   1st Diag lesion is 70% stenosed.   2nd Diag lesion is 70% stenosed.   Prox RCA to Mid RCA lesion is 60% stenosed.   Mid RCA lesion is 90% stenosed.   A drug-eluting stent was successfully placed using a SYNERGY XD [id number].   Post intervention, there is a 0% residual stenosis.   Post intervention, there is a 0% residual stenosis.   The left ventricular systolic function is normal.   LV end diastolic pressure is normal.   The left ventricular ejection fraction is 55-65% by visual estimate.   There is no mitral valve regurgitation.   Single vessel obstructive CAD Normal LV function Normal LVEDP Successful PCI of the mid RCA with DES x 1   Plan: the proximal LAD does not appear obstructive on Cath ( FFR abnormal  on CTA). Will treat medically unless he has significant angina. Continue DAPT for at least 6 months. Anticipate same day DC.   Coronary Diagrams  Diagnostic Dominance: Right Intervention  Implants   Event monitor 07/10/22:   Normal sinus rhythm   Few brief runs of SVT - 16 total. longest lasting 12 beat.   Otherwise rare ectopy   No Afib.     Patch Wear Time:  13 days and 20 hours (2023-10-21T11:50:23-0400 to 2023-11-04T08:36:20-0400)   Patient had a min HR of 50 bpm, max HR of 176 bpm, and avg HR of 73 bpm. Predominant underlying rhythm was Sinus Rhythm. 16 Supraventricular Tachycardia runs occurred, the run with the fastest interval lasting 4 beats with a max rate of 176 bpm, the  longest lasting 12 beats with an avg rate of 90 bpm. Isolated SVEs were rare (<1.0%), SVE Couplets were rare (<1.0%), and SVE Triplets were rare (<1.0%). Isolated VEs were rare (<1.0%, 74), VE Triplets were rare (<1.0%, 1), and no VE Couplets were  present.   Echo 08/22/22: IMPRESSIONS     1. Left ventricular ejection fraction, by estimation, is 65 to 70%. The  left ventricle has normal function. The left ventricle has no regional  wall motion abnormalities. There is mild concentric left ventricular  hypertrophy. Left ventricular diastolic  parameters are consistent with Grade I diastolic dysfunction (impaired  relaxation).   2. Right ventricular systolic function is normal. The right ventricular  size is normal. There is normal pulmonary artery systolic pressure.   3. Left atrial size was moderately dilated.   4. The mitral valve is normal in structure. Trivial mitral valve  regurgitation. No evidence of mitral  stenosis.   5. The aortic valve is tricuspid. Aortic valve regurgitation is not  visualized. No aortic stenosis is present.   6. The inferior vena cava is normal in size with greater than 50%  respiratory variability, suggesting right atrial pressure of 3 mmHg.   ASSESSMENT AND PLAN:  1.   CAD s/p DES of the mid RCA in May 2023.   LAD did not appear to be obstructive on cath. He is asymptomatic. Continue ASA and focus on optimizing lipid and diabetic control   2. Dysautonomia with significant orthostatic hypotension. Suspect this is related to his diabetes. No evidence of amyloid on Echo.  On no antihypertensives now. BP is high today but this is unusual for him. Asked him to monitor at home.   3. Hyperlipidemia. Very good response to Repatha. LDL dropped from 186 to 96. Continue current therapy and heart healthy diet.   4. Renal insufficieny. Creatinine 1.17. improved off ACEi    Current medicines are reviewed at length with the patient today.  The patient does not have concerns regarding medicines.  The following changes have been made:  no change     Disposition:   FU with me in 6 months  Signed, Lucita Montoya [place], MD  08/05/2023 8:55 AM    [facility] [address] [place], [place], [address] Phone [phone], Fax [phone]

## 2023-08-05 ENCOUNTER — Ambulatory Visit: Payer: Medicare HMO | Attending: Cardiology | Admitting: Cardiology

## 2023-08-05 ENCOUNTER — Encounter: Payer: Self-pay | Admitting: Cardiology

## 2023-08-05 VITALS — BP 170/80 | HR 67 | Ht 71.0 in | Wt 208.0 lb

## 2023-08-05 DIAGNOSIS — I951 Orthostatic hypotension: Secondary | ICD-10-CM

## 2023-08-05 DIAGNOSIS — I700 Atherosclerosis of aorta: Secondary | ICD-10-CM | POA: Diagnosis not present

## 2023-08-05 DIAGNOSIS — I2510 Atherosclerotic heart disease of native coronary artery without angina pectoris: Secondary | ICD-10-CM

## 2023-08-05 DIAGNOSIS — E785 Hyperlipidemia, unspecified: Secondary | ICD-10-CM | POA: Diagnosis not present

## 2023-08-05 NOTE — Patient Instructions (Signed)
Medication Instructions:  Continue all medications *If you need a refill on your cardiac medications before your next appointment, please call your pharmacy*   Lab Work: None ordered   Testing/Procedures: None ordered   Follow-Up: At [facility], you and your health needs are our priority.  As part of our continuing mission to provide you with exceptional heart care, we have created designated Provider Care Teams.  These Care Teams include your primary Cardiologist (physician) and Advanced Practice Providers (APPs -  Physician Assistants and Nurse Practitioners) who all work together to provide you with the care you need, when you need it.  We recommend signing up for the patient portal called "MyChart".  Sign up information is provided on this After Visit Summary.  MyChart is used to connect with patients for Virtual Visits (Telemedicine).  Patients are able to view lab/test results, encounter notes, upcoming appointments, etc.  Non-urgent messages can be sent to your provider as well.   To learn more about what you can do with MyChart, go to [email or web address].    Your next appointment:  6 months   Call in April to schedule June appointment     Provider:  Dr.Jordan

## 2023-08-11 ENCOUNTER — Encounter: Payer: Self-pay | Admitting: Internal Medicine

## 2023-08-11 ENCOUNTER — Ambulatory Visit: Payer: Medicare HMO | Admitting: Internal Medicine

## 2023-08-11 VITALS — BP 132/78 | HR 68 | Temp 98.0°F | Resp 18 | Ht 71.0 in | Wt 201.0 lb

## 2023-08-11 DIAGNOSIS — E1169 Type 2 diabetes mellitus with other specified complication: Secondary | ICD-10-CM | POA: Diagnosis not present

## 2023-08-11 DIAGNOSIS — I10 Essential (primary) hypertension: Secondary | ICD-10-CM | POA: Diagnosis not present

## 2023-08-11 DIAGNOSIS — I25118 Atherosclerotic heart disease of native coronary artery with other forms of angina pectoris: Secondary | ICD-10-CM | POA: Diagnosis not present

## 2023-08-11 DIAGNOSIS — E785 Hyperlipidemia, unspecified: Secondary | ICD-10-CM | POA: Diagnosis not present

## 2023-08-11 MED ORDER — PANTOPRAZOLE SODIUM 40 MG PO TBEC: 40.0000 mg | DELAYED_RELEASE_TABLET | Freq: Two times a day (BID) | ORAL | 6 refills | Status: DC

## 2023-08-11 MED ORDER — INSULIN DEGLUDEC 100 UNIT/ML ~~LOC~~ SOLN: 10.0000 [IU] | Freq: Every day | SUBCUTANEOUS | 6 refills | Status: DC

## 2023-08-11 MED ORDER — TRULICITY 0.75 MG/0.5ML ~~LOC~~ SOAJ: 0.7500 mg | SUBCUTANEOUS | 4 refills | Status: DC

## 2023-08-11 MED ORDER — SYNJARDY XR 12.5-1000 MG PO TB24: 2.0000 | ORAL_TABLET | Freq: Every day | ORAL | 3 refills | Status: DC

## 2023-08-11 MED ORDER — REPATHA PUSHTRONEX SYSTEM 420 MG/3.5ML ~~LOC~~ SOCT: 140.0000 mg | SUBCUTANEOUS | 6 refills | Status: DC

## 2023-08-11 NOTE — Progress Notes (Addendum)
Office Visit  Subjective   Patient ID: Ivan Dickson   DOB: 04/23/1951   Age: 72 y.o.   MRN: [id number]   Chief Complaint Chief Complaint  Patient presents with   Follow-up    3 month     History of Present Illness The patient is a 72 year old Caucasian/White male who returns for a follow-up.   He says that he fell down when he tripped and hurt his arm but wound is healing.  His blood sugar was better with HbA1c of 7.2%  in September 24. He take [place] 10 units daily. He has CGM.   He has not seen eye doctor.  He does not have protein urea. He take  synjardy and tresiba 12 units daily. He also take pioglitazone 30 mg tablet, and he is not taking ozempic but is willing to take it. He specifically denies chest pain, shortness of breath, unexplained fatigue, palpitations or racing heartbeats, syncopal episodes, unexplained abdominal pain, nausea or vomiting.   The patient is a 72 year old Caucasian/White male, who presents for the follow-up evaluation of GERD. Since the last visit the patient is doing well with no new complaints. he continues on Pantoprazole which is effective for mgt.     He reports he has been doing well since his coronary artery stent placement in 12/2021 where he had been having severe dizziness and lightheadedness especially noted when working in his yard. His coronary CTA indicated severe disease in his mid  RCA and he was stented with DES. He had moderate disease in the diagonal branches  but his LAD was nonobstructive. He was placed on Plavix and Aspirin which he continues today with no s/s bleeding reported.  His Coronary calcium score was 1129    Past Medical History Past Medical History:  Diagnosis Date   Aortic atherosclerosis (HCC)    CAD (coronary artery disease)    s/p DES to mid RCA 12/2021   Diabetes mellitus without complication (HCC)    Dizziness    GERD (gastroesophageal reflux disease)    High cholesterol    History of colon polyps    HTN  (hypertension)    Lightheadedness      Allergies Allergies  Allergen Reactions   Midodrine Other (See Comments)    Bloating and gas   Ciprofloxacin Itching and Rash     Review of Systems Review of Systems  Constitutional: Negative.   HENT: Negative.    Respiratory: Negative.    Cardiovascular: Negative.   Gastrointestinal: Negative.   Neurological: Negative.        Objective:    Vitals BP 132/78 (BP Location: Right Arm, Patient Position: Sitting, Cuff Size: Normal)   Pulse 68   Temp 98 F (36.7 C)   Resp 18   Ht 5\' 11"  (1.803 m)   Wt 201 lb (91.2 kg)   SpO2 94%   BMI 28.03 kg/m    Physical Examination Physical Exam Constitutional:      Appearance: Normal appearance.  HENT:     Head: Normocephalic and atraumatic.  Cardiovascular:     Rate and Rhythm: Normal rate and regular rhythm.     Heart sounds: Normal heart sounds.  Pulmonary:     Effort: Pulmonary effort is normal.     Breath sounds: Normal breath sounds.  Abdominal:     General: Bowel sounds are normal.     Palpations: Abdomen is soft.  Neurological:     General: No focal deficit present.  Mental Status: He is alert and oriented to person, place, and time.        Assessment & Plan:   CAD (coronary artery disease)   Stable he will continue to follow with cardiologist.  He took Plavix and aspirin for 6 minutes and currently only taking aspirin.  HTN (hypertension)   Well controlled.  Dyslipidemia associated with type 2 diabetes mellitus (HCC)   His blood sugar is controlled and will continue to monitor lipid panel  As he was started on Repatha injection while he continue to take rosuvastatin 40 mg daily.    Return in about 3 months (around 11/09/2023).   Eloisa Northern, MD

## 2023-08-11 NOTE — Assessment & Plan Note (Signed)
Well controlled 

## 2023-08-11 NOTE — Assessment & Plan Note (Signed)
His blood sugar is controlled and will continue to monitor lipid panel  As he was started on Repatha injection while he continue to take rosuvastatin 40 mg daily.

## 2023-08-11 NOTE — Assessment & Plan Note (Signed)
Stable he will continue to follow with cardiologist.  He took Plavix and aspirin for 6 minutes and currently only taking aspirin.

## 2023-08-12 DIAGNOSIS — J310 Chronic rhinitis: Secondary | ICD-10-CM | POA: Diagnosis not present

## 2023-08-12 DIAGNOSIS — G4761 Periodic limb movement disorder: Secondary | ICD-10-CM | POA: Diagnosis not present

## 2023-08-12 DIAGNOSIS — R053 Chronic cough: Secondary | ICD-10-CM | POA: Diagnosis not present

## 2023-09-29 DIAGNOSIS — R0989 Other specified symptoms and signs involving the circulatory and respiratory systems: Secondary | ICD-10-CM | POA: Diagnosis not present

## 2023-09-29 DIAGNOSIS — R09A2 Foreign body sensation, throat: Secondary | ICD-10-CM | POA: Diagnosis not present

## 2023-09-29 DIAGNOSIS — K219 Gastro-esophageal reflux disease without esophagitis: Secondary | ICD-10-CM | POA: Diagnosis not present

## 2023-10-10 ENCOUNTER — Other Ambulatory Visit: Payer: Self-pay

## 2023-10-10 MED ORDER — REPATHA SURECLICK 140 MG/ML ~~LOC~~ SOAJ: 140.0000 mg | SUBCUTANEOUS | 2 refills | Status: DC

## 2023-11-07 ENCOUNTER — Ambulatory Visit: Payer: Medicare HMO | Admitting: Internal Medicine

## 2023-11-18 IMAGING — DX DG HIP (WITH OR WITHOUT PELVIS) 2-3V*L*
3 series · 3 of 3 positions shown · non-contrast
Comparison: None.

CLINICAL DATA: Left hip pain.

EXAM:
DG HIP (WITH OR WITHOUT PELVIS) 2-3V LEFT

[pelvis ap]
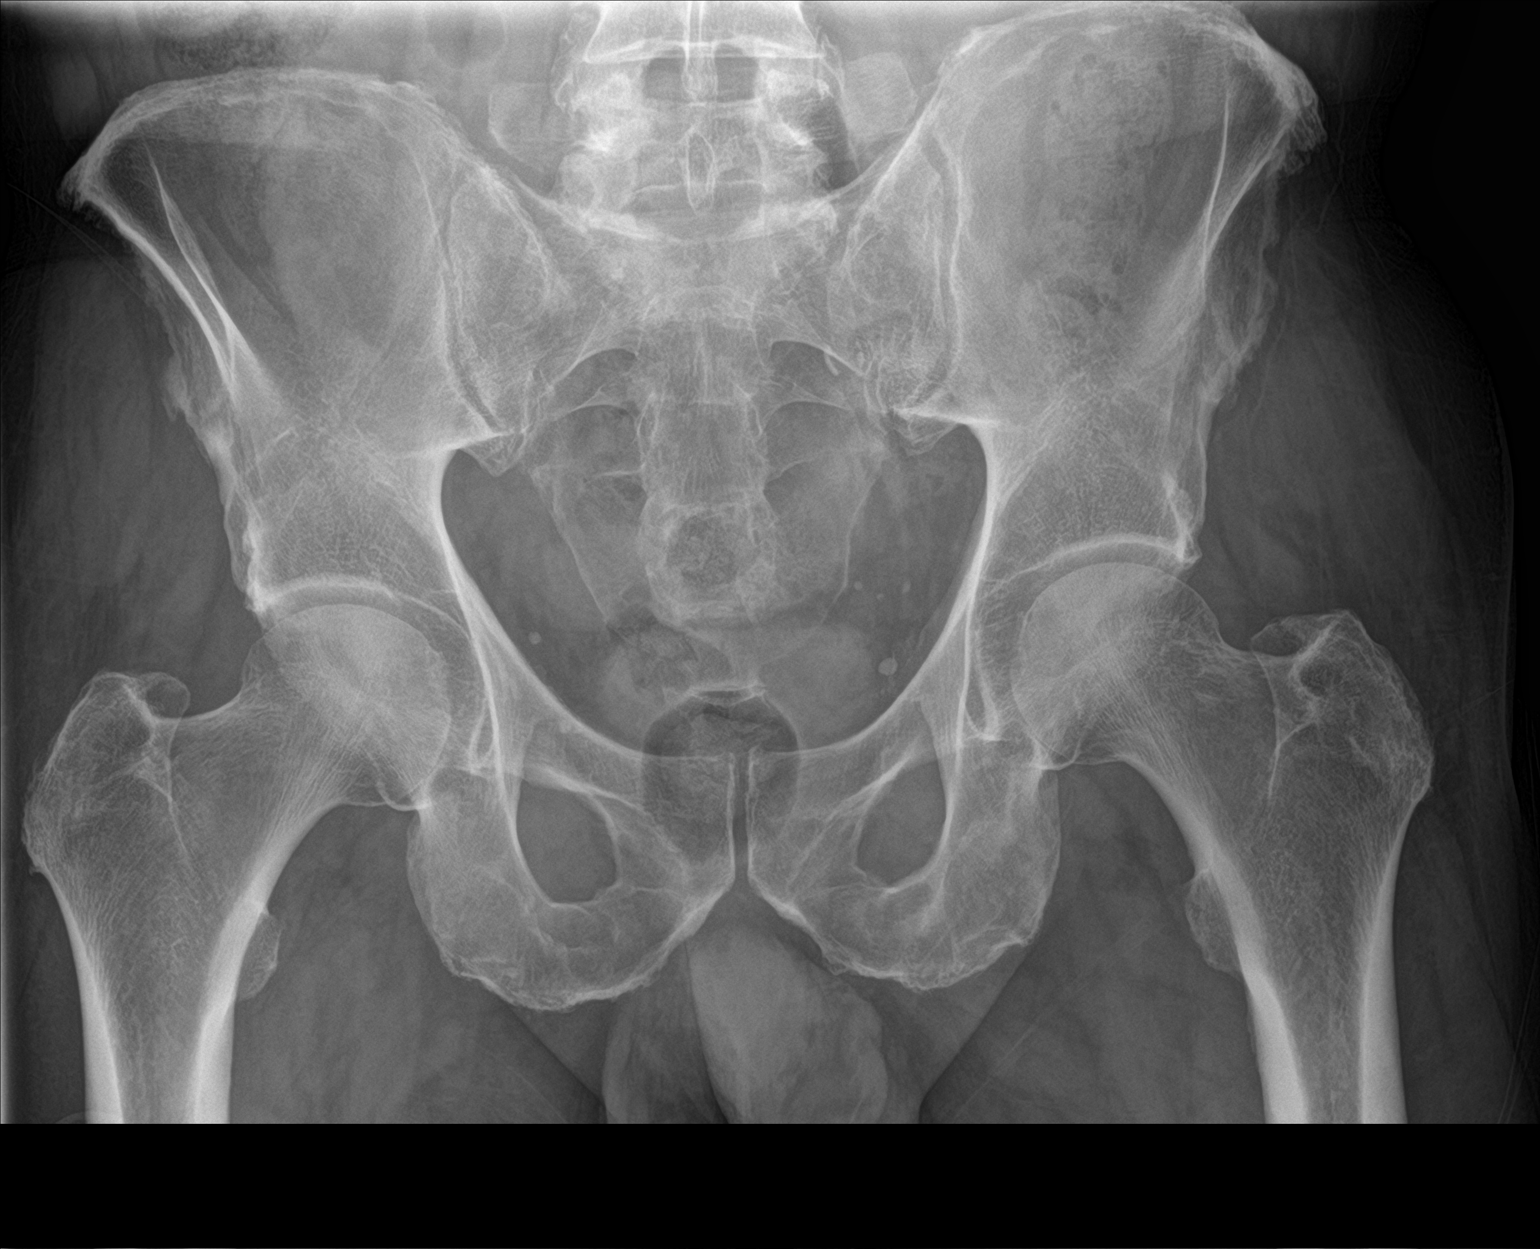

[hip ap]
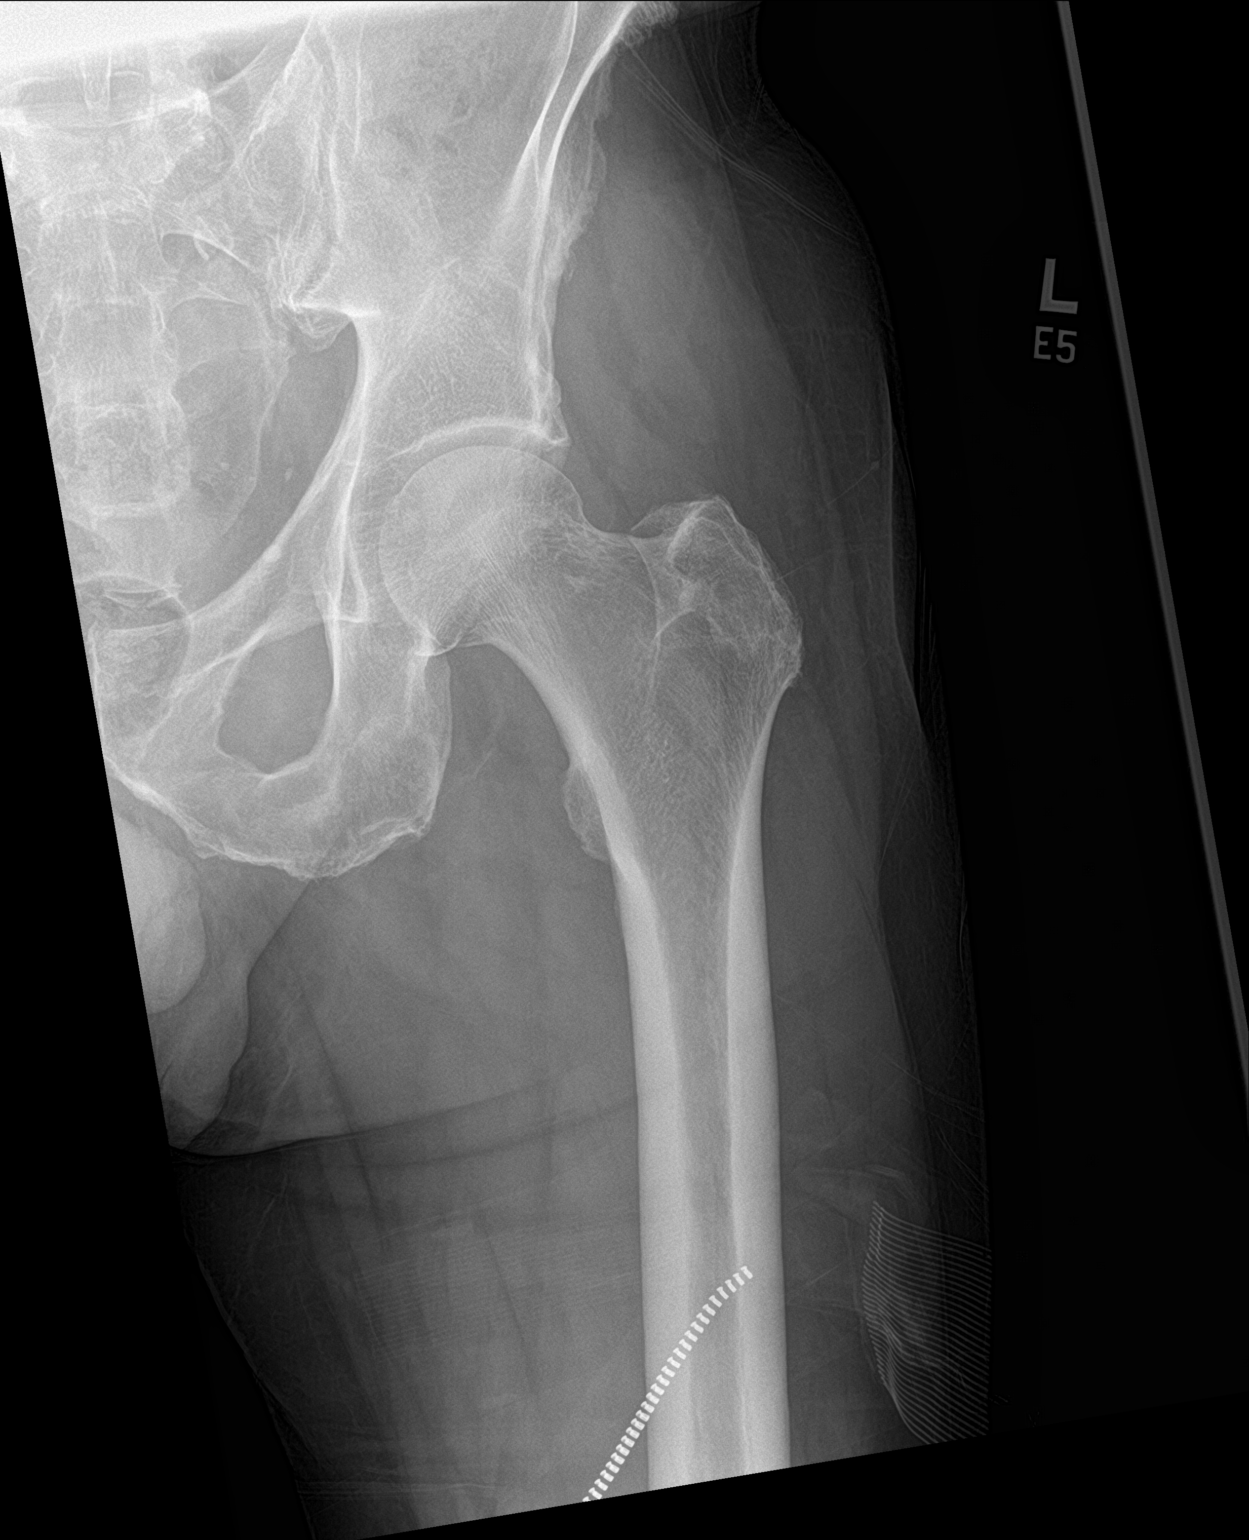

[hip lat]
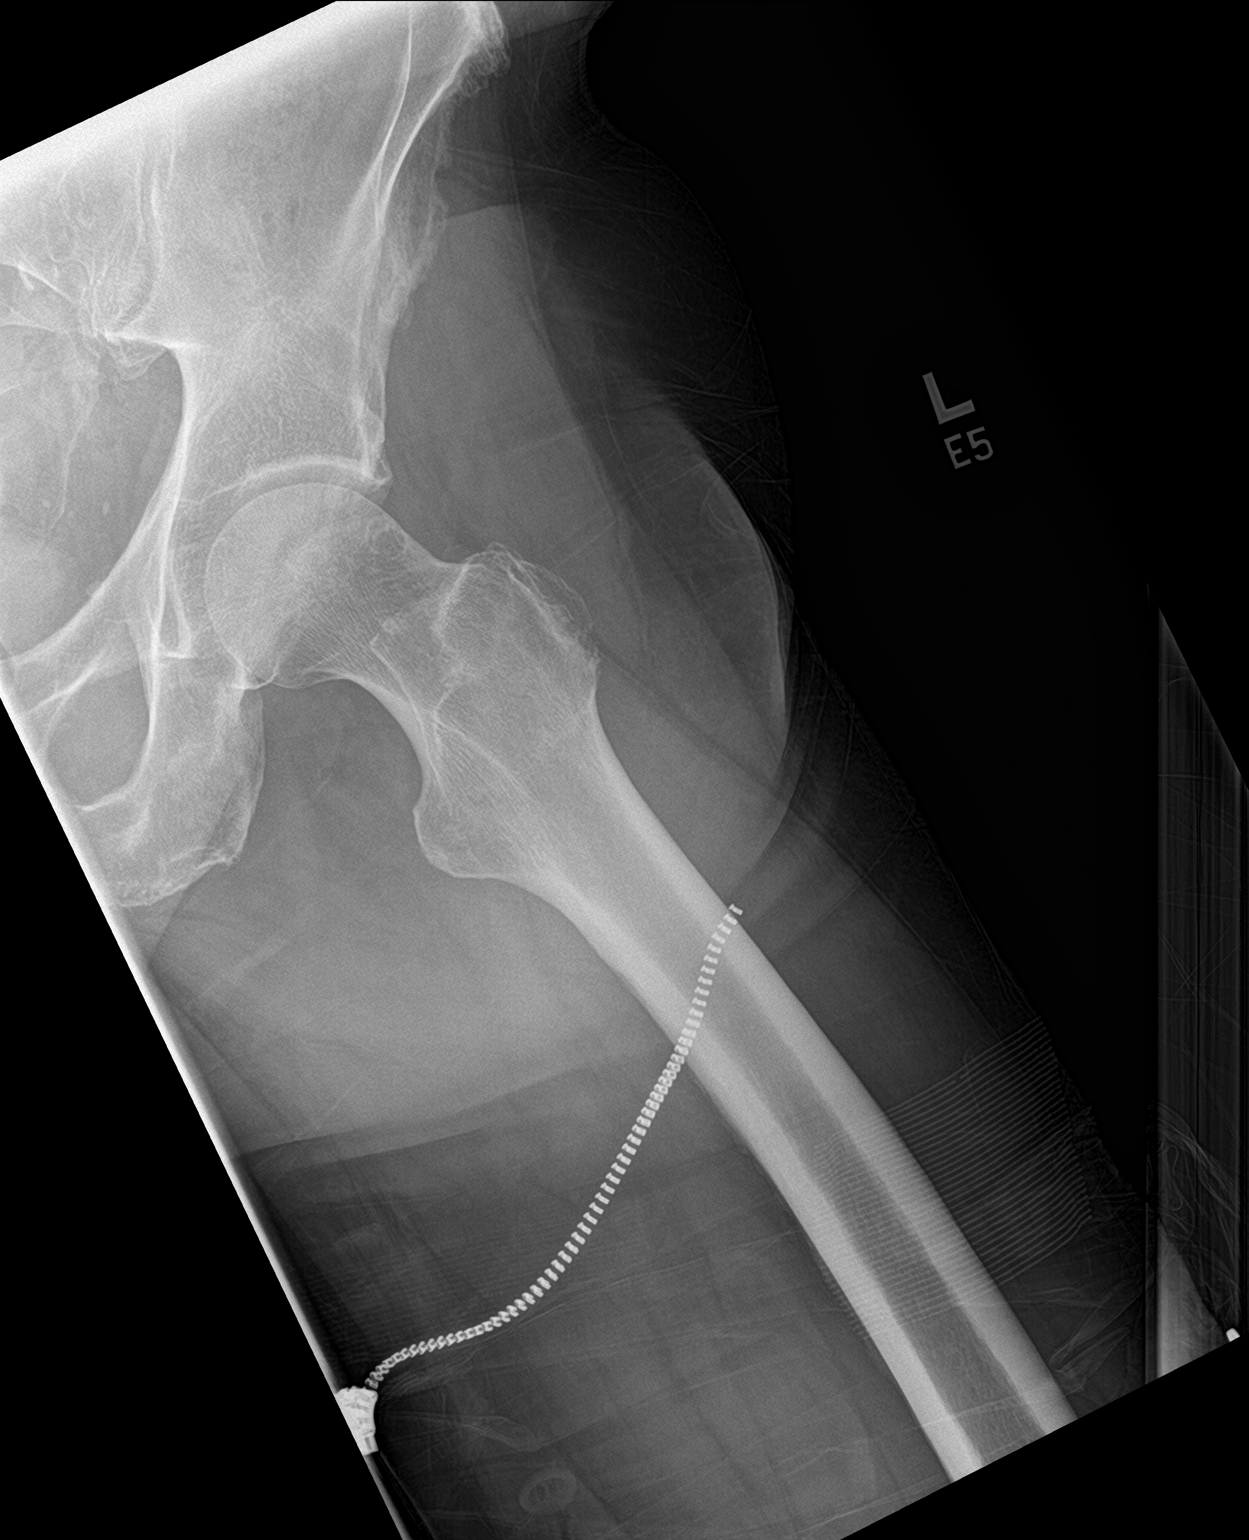

[3 of 3 positions shown; findings below may reference images not displayed]

FINDINGS: There is no evidence of hip fracture or dislocation. There is no
evidence of arthropathy or other focal bone abnormality.
IMPRESSION: Negative.

## 2023-11-21 ENCOUNTER — Encounter: Payer: Self-pay | Admitting: Internal Medicine

## 2023-11-21 ENCOUNTER — Ambulatory Visit: Payer: Medicare HMO | Admitting: Internal Medicine

## 2023-11-21 VITALS — BP 122/70 | HR 65 | Temp 98.0°F | Resp 18 | Ht 71.0 in | Wt 199.1 lb

## 2023-11-21 DIAGNOSIS — R21 Rash and other nonspecific skin eruption: Secondary | ICD-10-CM | POA: Diagnosis not present

## 2023-11-21 DIAGNOSIS — E1169 Type 2 diabetes mellitus with other specified complication: Secondary | ICD-10-CM

## 2023-11-21 DIAGNOSIS — E785 Hyperlipidemia, unspecified: Secondary | ICD-10-CM

## 2023-11-21 DIAGNOSIS — I25118 Atherosclerotic heart disease of native coronary artery with other forms of angina pectoris: Secondary | ICD-10-CM | POA: Diagnosis not present

## 2023-11-21 DIAGNOSIS — I10 Essential (primary) hypertension: Secondary | ICD-10-CM

## 2023-11-21 MED ORDER — TRULICITY 1.5 MG/0.5ML ~~LOC~~ SOAJ: 1.5000 mg | SUBCUTANEOUS | 6 refills | Status: DC

## 2023-11-21 NOTE — Assessment & Plan Note (Signed)
 Stable, he follows with cardiologist every 6 months

## 2023-11-21 NOTE — Assessment & Plan Note (Signed)
 controlled

## 2023-11-21 NOTE — Assessment & Plan Note (Signed)
 His lipid panel and Hb A1c is good, will try to wean his tresiba to 5 units daily and monitor.

## 2023-11-21 NOTE — Progress Notes (Signed)
 Office Visit  Subjective   Patient ID: Ivan Dickson   DOB: September 25, 1950   Age: 73 y.o.   MRN: [id number]   Chief Complaint Chief Complaint  Patient presents with   Coronary Artery Disease    3 month follow     History of Present Illness The patient is a 73 year old Caucasian/White male who returns for a follow-up. He says he has not been using CGM because of cost and he has [facility] benefit and he hope that they may cover. No hypoglycemia. His blood sugar was better with HbA1c of 7.2%  in September 24. He take [place] 10 units daily. He has not seen eye doctor and want referral to be send to [facility] in [place].  He does not have protein urea. He take  synjardy and tresiba 12 units daily. He also take pioglitazone 30 mg tablet, and he is not taking ozempic but is willing to take it. He specifically denies chest pain, shortness of breath, unexplained fatigue, palpitations or racing heartbeats, syncopal episodes, unexplained abdominal pain, nausea or vomiting.    The patient is a 73 year old Caucasian/White male, who presents for the follow-up evaluation of GERD. Since the last visit the patient is doing well with no new complaints. he continues on Pantoprazole which is effective for mgt.     He reports he has been doing well since his coronary artery stent placement in 12/2021 where he had been having severe dizziness and lightheadedness especially noted when working in his yard. His coronary CTA indicated severe disease in his mid  RCA and he was stented with DES. He had moderate disease in the diagonal branches  but his LAD was nonobstructive. He was placed on Plavix and Aspirin which he continues today with no s/s bleeding reported.  His Coronary calcium score was 1129     Past Medical History Past Medical History:  Diagnosis Date   Aortic atherosclerosis (HCC)    CAD (coronary artery disease)    s/p DES to mid RCA 12/2021   Diabetes mellitus without complication (HCC)    Dizziness     GERD (gastroesophageal reflux disease)    High cholesterol    History of colon polyps    HTN (hypertension)    Lightheadedness      Allergies Allergies  Allergen Reactions   Midodrine Other (See Comments)    Bloating and gas   Ciprofloxacin Itching and Rash     Review of Systems Review of Systems  Constitutional: Negative.   HENT: Negative.    Respiratory: Negative.    Cardiovascular: Negative.   Gastrointestinal: Negative.   Neurological: Negative.        Objective:    Vitals BP 122/70 (BP Location: Left Arm, Patient Position: Sitting)   Pulse 65   Temp 98 F (36.7 C)   Resp 18   Ht 5\' 11"  (1.803 m)   Wt 199 lb 2 oz (90.3 kg)   SpO2 98%   BMI 27.77 kg/m    Physical Examination Physical Exam Constitutional:      Appearance: Normal appearance.  HENT:     Head: Normocephalic and atraumatic.  Cardiovascular:     Rate and Rhythm: Normal rate and regular rhythm.     Heart sounds: Normal heart sounds.  Pulmonary:     Effort: Pulmonary effort is normal.     Breath sounds: Normal breath sounds.  Abdominal:     General: Bowel sounds are normal.     Palpations:  Abdomen is soft.  Neurological:     General: No focal deficit present.     Mental Status: He is alert and oriented to person, place, and time.        Assessment & Plan:   CAD (coronary artery disease) Stable, he follows with cardiologist every 6 months  HTN (hypertension) controlled  Dyslipidemia associated with type 2 diabetes mellitus (HCC) His lipid panel and Hb A1c is good, will try to wean his tresiba to 5 units daily and monitor.     Return in about 3 months (around 02/21/2024).   Eloisa Northern, MD

## 2023-11-28 DIAGNOSIS — R053 Chronic cough: Secondary | ICD-10-CM | POA: Diagnosis not present

## 2023-11-28 DIAGNOSIS — J310 Chronic rhinitis: Secondary | ICD-10-CM | POA: Diagnosis not present

## 2023-11-28 DIAGNOSIS — G4761 Periodic limb movement disorder: Secondary | ICD-10-CM | POA: Diagnosis not present

## 2023-12-22 ENCOUNTER — Other Ambulatory Visit: Payer: Self-pay | Admitting: Internal Medicine

## 2023-12-24 ENCOUNTER — Other Ambulatory Visit: Payer: Self-pay | Admitting: Internal Medicine

## 2024-02-07 ENCOUNTER — Other Ambulatory Visit: Payer: Self-pay

## 2024-02-07 ENCOUNTER — Encounter (HOSPITAL_COMMUNITY): Payer: Self-pay

## 2024-02-07 ENCOUNTER — Emergency Department (HOSPITAL_COMMUNITY)

## 2024-02-07 ENCOUNTER — Observation Stay (HOSPITAL_COMMUNITY)
Admission: EM | Admit: 2024-02-07 | Discharge: 2024-02-08 | Disposition: A | Discharge status: Home / Self Care | Attending: Family Medicine | Admitting: Family Medicine

## 2024-02-07 DIAGNOSIS — F1090 Alcohol use, unspecified, uncomplicated: Secondary | ICD-10-CM | POA: Insufficient documentation

## 2024-02-07 DIAGNOSIS — E1169 Type 2 diabetes mellitus with other specified complication: Secondary | ICD-10-CM | POA: Diagnosis present

## 2024-02-07 DIAGNOSIS — I2510 Atherosclerotic heart disease of native coronary artery without angina pectoris: Secondary | ICD-10-CM

## 2024-02-07 DIAGNOSIS — Z7901 Long term (current) use of anticoagulants: Secondary | ICD-10-CM | POA: Insufficient documentation

## 2024-02-07 DIAGNOSIS — Z79899 Other long term (current) drug therapy: Secondary | ICD-10-CM | POA: Insufficient documentation

## 2024-02-07 DIAGNOSIS — E871 Hypo-osmolality and hyponatremia: Secondary | ICD-10-CM | POA: Diagnosis not present

## 2024-02-07 DIAGNOSIS — I25118 Atherosclerotic heart disease of native coronary artery with other forms of angina pectoris: Secondary | ICD-10-CM

## 2024-02-07 DIAGNOSIS — N179 Acute kidney failure, unspecified: Secondary | ICD-10-CM | POA: Diagnosis present

## 2024-02-07 DIAGNOSIS — I951 Orthostatic hypotension: Secondary | ICD-10-CM | POA: Diagnosis not present

## 2024-02-07 DIAGNOSIS — K219 Gastro-esophageal reflux disease without esophagitis: Secondary | ICD-10-CM | POA: Diagnosis not present

## 2024-02-07 DIAGNOSIS — R55 Syncope and collapse: Secondary | ICD-10-CM | POA: Diagnosis present

## 2024-02-07 DIAGNOSIS — I10 Essential (primary) hypertension: Secondary | ICD-10-CM | POA: Diagnosis present

## 2024-02-07 DIAGNOSIS — M109 Gout, unspecified: Secondary | ICD-10-CM | POA: Diagnosis present

## 2024-02-07 DIAGNOSIS — Z7984 Long term (current) use of oral hypoglycemic drugs: Secondary | ICD-10-CM | POA: Insufficient documentation

## 2024-02-07 DIAGNOSIS — R52 Pain, unspecified: Secondary | ICD-10-CM | POA: Diagnosis not present

## 2024-02-07 DIAGNOSIS — M792 Neuralgia and neuritis, unspecified: Secondary | ICD-10-CM | POA: Diagnosis present

## 2024-02-07 DIAGNOSIS — E785 Hyperlipidemia, unspecified: Secondary | ICD-10-CM | POA: Insufficient documentation

## 2024-02-07 LAB — CBC
HCT: 42.2 % (ref 39.0–52.0)
Hemoglobin: 13.5 g/dL (ref 13.0–17.0)
MCH: 29.1 pg (ref 26.0–34.0)
MCHC: 32 g/dL (ref 30.0–36.0)
MCV: 90.9 fL (ref 80.0–100.0)
Platelets: 221 10*3/uL (ref 150–400)
RBC: 4.64 MIL/uL (ref 4.22–5.81)
RDW: 13.1 % (ref 11.5–15.5)
WBC: 8.5 10*3/uL (ref 4.0–10.5)
nRBC: 0 % (ref 0.0–0.2)

## 2024-02-07 LAB — COMPREHENSIVE METABOLIC PANEL WITH GFR
ALT: 19 U/L (ref 0–44)
AST: 21 U/L (ref 15–41)
Albumin: 3.8 g/dL (ref 3.5–5.0)
Alkaline Phosphatase: 55 U/L (ref 38–126)
Anion gap: 8 (ref 5–15)
BUN: 32 mg/dL — ABNORMAL HIGH (ref 8–23)
CO2: 22 mmol/L (ref 22–32)
Calcium: 9.1 mg/dL (ref 8.9–10.3)
Chloride: 104 mmol/L (ref 98–111)
Creatinine, Ser: 1.46 mg/dL — ABNORMAL HIGH (ref 0.61–1.24)
GFR, Estimated: 51 mL/min — ABNORMAL LOW (ref >60)
Glucose, Bld: 285 mg/dL — ABNORMAL HIGH (ref 70–99)
Potassium: 4.2 mmol/L (ref 3.5–5.1)
Sodium: 134 mmol/L — ABNORMAL LOW (ref 135–145)
Total Bilirubin: 0.8 mg/dL (ref 0.0–1.2)
Total Protein: 7.4 g/dL (ref 6.5–8.1)

## 2024-02-07 LAB — TROPONIN I (HIGH SENSITIVITY)
Troponin I (High Sensitivity): 3 ng/L (ref <18)
Troponin I (High Sensitivity): 4 ng/L (ref <18)

## 2024-02-07 LAB — LIPASE, BLOOD: Lipase: 41 U/L (ref 11–51)

## 2024-02-07 LAB — MAGNESIUM: Magnesium: 1.4 mg/dL — ABNORMAL LOW (ref 1.7–2.4)

## 2024-02-07 MED ORDER — ACETAMINOPHEN 325 MG PO TABS: 650.0000 mg | ORAL_TABLET | Freq: Four times a day (QID) | ORAL | Status: DC | PRN

## 2024-02-07 MED ORDER — ASPIRIN 81 MG PO TBEC
81.0000 mg | DELAYED_RELEASE_TABLET | Freq: Every day | ORAL | Status: DC
  Administered 2024-02-08: 81 mg via ORAL
  Filled 2024-02-07: qty 1

## 2024-02-07 MED ORDER — SODIUM CHLORIDE 0.9 % IV SOLN: INTRAVENOUS | Status: DC

## 2024-02-07 MED ORDER — MAGNESIUM SULFATE 2 GM/50ML IV SOLN
2.0000 g | Freq: Once | INTRAVENOUS | Status: AC
  Administered 2024-02-07: 2 g via INTRAVENOUS
  Filled 2024-02-07: qty 50

## 2024-02-07 MED ORDER — MAGNESIUM OXIDE -MG SUPPLEMENT 400 (240 MG) MG PO TABS
400.0000 mg | ORAL_TABLET | Freq: Every day | ORAL | Status: DC
  Administered 2024-02-07 – 2024-02-08 (×2): 400 mg via ORAL
  Filled 2024-02-07 (×2): qty 1

## 2024-02-07 MED ORDER — POLYETHYLENE GLYCOL 3350 17 G PO PACK: 17.0000 g | PACK | Freq: Every day | ORAL | Status: DC | PRN

## 2024-02-07 MED ORDER — PANTOPRAZOLE SODIUM 40 MG PO TBEC
40.0000 mg | DELAYED_RELEASE_TABLET | Freq: Two times a day (BID) | ORAL | Status: DC
  Administered 2024-02-07 – 2024-02-08 (×2): 40 mg via ORAL
  Filled 2024-02-07 (×2): qty 1

## 2024-02-07 MED ORDER — ENOXAPARIN SODIUM 40 MG/0.4ML IJ SOSY
40.0000 mg | PREFILLED_SYRINGE | INTRAMUSCULAR | Status: DC
  Administered 2024-02-07: 40 mg via SUBCUTANEOUS
  Filled 2024-02-07: qty 0.4

## 2024-02-07 MED ORDER — SODIUM CHLORIDE 0.9 % IV BOLUS
1000.0000 mL | Freq: Once | INTRAVENOUS | Status: AC
  Administered 2024-02-07: 999 mL via INTRAVENOUS

## 2024-02-07 MED ORDER — FLUDROCORTISONE ACETATE 0.1 MG PO TABS: 0.1000 mg | ORAL_TABLET | Freq: Every day | ORAL | 3 refills | Status: DC | PRN

## 2024-02-07 MED ORDER — SODIUM CHLORIDE 0.9% FLUSH
3.0000 mL | Freq: Two times a day (BID) | INTRAVENOUS | Status: DC
  Administered 2024-02-07: 3 mL via INTRAVENOUS

## 2024-02-07 MED ORDER — ACETAMINOPHEN 650 MG RE SUPP: 650.0000 mg | Freq: Four times a day (QID) | RECTAL | Status: DC | PRN

## 2024-02-07 MED ORDER — FLUDROCORTISONE ACETATE 0.1 MG PO TABS: 0.1000 mg | ORAL_TABLET | Freq: Every day | ORAL | Status: DC | PRN

## 2024-02-07 MED ORDER — ROSUVASTATIN CALCIUM 20 MG PO TABS
40.0000 mg | ORAL_TABLET | Freq: Every day | ORAL | Status: DC
  Administered 2024-02-08: 40 mg via ORAL
  Filled 2024-02-07: qty 2

## 2024-02-07 NOTE — ED Triage Notes (Signed)
 Pt arrived POV from home c/o passing out. Pt is unsure if he had complete LOC but does not think he hit his head.

## 2024-02-07 NOTE — Consult Note (Signed)
 CARDIOLOGY CONSULT NOTE       Patient ID: Ivan Dickson MRN: [id number] DOB/AGE: 12-07-1950 73 y.o.  Admit date: 02/07/2024 Referring Physician: Delana Favors Primary Physician: [name], MD Primary Cardiologist: [place] Reason for Consultation: Pre syncope  Principal Problem:   Syncope and collapse Active Problems:   CAD (coronary artery disease)   HTN (hypertension)   Dyslipidemia associated with type 2 diabetes mellitus (HCC)   Peripheral neuropathic pain   Gastroesophageal reflux disease   Gout   AKI (acute kidney injury) (HCC)   HPI:  73 y.o. admitted with pre syncope. History of CAD with DES to RCA May 2023. No other obstructive dx. EF has been normal. He is prone to lose stools and postural dizziness. He is intolerant to midodrine  with gastric bloating He has used florinef  in past with good support of his BP but felt he got supine HTN. Has has diarrhea for 3 days. No fever no sick contacts and has not eaten out anywhere special. No chest pain, dyspnea, or palpitations.   In ER NSR rates 70;s BP 120 systolic did not do posturals.  ECG is totally normal  Troponin negative  Labs with azotemia BUN 32/Cr 1.46   ROS All other systems reviewed and negative except as noted above  Past Medical History:  Diagnosis Date   Aortic atherosclerosis (HCC)    CAD (coronary artery disease)    s/p DES to mid RCA 12/2021   Diabetes mellitus without complication (HCC)    Dizziness    GERD (gastroesophageal reflux disease)    High cholesterol    History of colon polyps    HTN (hypertension)    Lightheadedness    Nonadherence with dietary restriction 10/20/2017   NONCOMPLIANCE WITH DIETARY REGIMEN; Original code:Z91.11Entered By: Oran Bimler MD; Signed By: Oran Bimler MD     Nonspecific syndrome suggestive of viral illness 09/14/2016   VIRAL SYNDROME; Original code:B97.89Entered By: Trudee Furth M.D.; Signed By: Trudee Furth M.D.      Family History  Problem Relation Age of  Onset   Hyperlipidemia Mother    Hypertension Mother    Heart disease Mother    Diabetes Mother    Stroke Father    Arrhythmia Sister    Kidney cancer Sister        renal cell cancer   Breast cancer Sister    Heart attack Brother    Heart disease Brother        CABG   Heart failure Son    Colon cancer Neg Hx    Esophageal cancer Neg Hx     Social History   Socioeconomic History   Marital status: Married    Spouse name: Not on file   Number of children: 2   Years of education: Not on file   Highest education level: Not on file  Occupational History   Not on file  Tobacco Use   Smoking status: Never   Smokeless tobacco: Never  Vaping Use   Vaping status: Never Used  Substance and Sexual Activity   Alcohol use: Yes    Comment: occasnionally   Drug use: Not Currently   Sexual activity: Yes  Other Topics Concern   Not on file  Social History Narrative   Retired from Software engineer   Social Drivers of Corporate investment banker Strain: Not on file  Food Insecurity: Not on file  Transportation Needs: Not on file  Physical Activity: Not on file  Stress: Not on file  Social Connections: Not  on file  Intimate Partner Violence: Not on file    Past Surgical History:  Procedure Laterality Date   APPENDECTOMY     COLONOSCOPY     found polyps over 10 years ago   CORONARY STENT INTERVENTION N/A 01/22/2022   Procedure: CORONARY STENT INTERVENTION;  Surgeon: [place], Kirra Verga M, MD;  Location: [facility] INVASIVE CV LAB;  Service: Cardiovascular;  Laterality: N/A;   ESOPHAGOGASTRODUODENOSCOPY     over 10 years ago thinks he did   HAND SURGERY Right    LEFT HEART CATH AND CORONARY ANGIOGRAPHY N/A 01/22/2022   Procedure: LEFT HEART CATH AND CORONARY ANGIOGRAPHY;  Surgeon: [place], Marlisha Vanwyk M, MD;  Location: [facility] INVASIVE CV LAB;  Service: Cardiovascular;  Laterality: N/A;      Current Facility-Administered Medications:    0.9 %  sodium chloride  infusion, , Intravenous, Continuous, Opie Maclaughlin,  Isaac Dubie C, MD   acetaminophen  (TYLENOL ) tablet 650 mg, 650 mg, Oral, Q6H PRN **OR** acetaminophen  (TYLENOL ) suppository 650 mg, 650 mg, Rectal, Q6H PRN, Melvin, Alexander B, MD   [START ON 02/08/2024] aspirin  EC tablet 81 mg, 81 mg, Oral, Daily, Melvin, Alexander B, MD   enoxaparin (LOVENOX) injection 40 mg, 40 mg, Subcutaneous, Q24H, Melvin, Alexander B, MD   fludrocortisone  (FLORINEF ) tablet 0.1 mg, 0.1 mg, Oral, Daily PRN, Debria Fang, PA-C   magnesium oxide (MAG-OX) tablet 400 mg, 400 mg, Oral, Daily, Melvin, Alexander B, MD   magnesium sulfate IVPB 2 g 50 mL, 2 g, Intravenous, Once, Johnetta Nab, MD   pantoprazole  (PROTONIX ) EC tablet 40 mg, 40 mg, Oral, BID, Melvin, Alexander B, MD   polyethylene glycol (MIRALAX / GLYCOLAX) packet 17 g, 17 g, Oral, Daily PRN, Melvin, Alexander B, MD   [START ON 02/08/2024] rosuvastatin  (CRESTOR ) tablet 40 mg, 40 mg, Oral, Daily, Melvin, Alexander B, MD   sodium chloride  0.9 % bolus 1,000 mL, 1,000 mL, Intravenous, Once, Johnetta Nab, MD   sodium chloride  flush (NS) 0.9 % injection 3 mL, 3 mL, Intravenous, Q12H, Melvin, Alexander B, MD  Current Outpatient Medications:    LORazepam (ATIVAN) 1 MG tablet, Take 1 tablet by mouth at bedtime., Disp: , Rfl:    aspirin  EC 81 MG tablet, Take 1 tablet (81 mg total) by mouth daily. Swallow whole., Disp: 90 tablet, Rfl: 3   Blood Glucose Monitoring Suppl (TRUE METRIX METER) w/Device KIT, See admin instructions., Disp: , Rfl:    Continuous Glucose Sensor (DEXCOM G7 SENSOR) MISC, Use to check blood glucose levels. Replace sensor every 10 days., Disp: 3 each, Rfl: 6   Dulaglutide (TRULICITY) 1.5 MG/0.5ML SOAJ, Inject 1.5 mg into the skin once a week., Disp: 4 mL, Rfl: 6   Empagliflozin-metFORMIN  HCl ER (SYNJARDY  XR) 12.12-998 MG TB24, Take 2 tablets by mouth daily in the afternoon., Disp: 180 tablet, Rfl: 3   fluticasone (FLONASE) 50 MCG/ACT nasal spray, Place into both nostrils., Disp: , Rfl:    Insulin   Degludec (TRESIBA ) 100 UNIT/ML SOLN, Inject 10 Units into the skin daily in the afternoon., Disp: 2 mL, Rfl: 6   Insulin  Pen Needle (BD PEN NEEDLE NANO 2ND GEN) 32G X 4 MM MISC, Use when taking the tresiba  10 units daily, Disp: 100 each, Rfl: 6   Magnesium 400 MG CAPS, Take 400 mg by mouth daily., Disp: , Rfl:    pantoprazole  (PROTONIX ) 40 MG tablet, Take 1 tablet (40 mg total) by mouth 2 (two) times daily., Disp: 30 tablet, Rfl: 6   pioglitazone  (ACTOS ) 30 MG tablet, TAKE 1  TABLET(30 MG) BY MOUTH DAILY, Disp: 90 tablet, Rfl: 0   REPATHA  SURECLICK 140 MG/ML SOAJ, ADMINISTER 1 ML UNDER THE SKIN EVERY 14 DAYS, Disp: 2 mL, Rfl: 2   rosuvastatin  (CRESTOR ) 40 MG tablet, Take 1 tablet (40 mg total) by mouth daily., Disp: 90 tablet, Rfl: 4   TRESIBA  FLEXTOUCH 100 UNIT/ML FlexTouch Pen, Inject 10 Units into the skin daily., Disp: , Rfl:    TRUE METRIX BLOOD GLUCOSE TEST test strip, daily., Disp: , Rfl:   [START ON 02/08/2024] aspirin  EC  81 mg Oral Daily   enoxaparin (LOVENOX) injection  40 mg Subcutaneous Q24H   magnesium oxide  400 mg Oral Daily   pantoprazole   40 mg Oral BID   [START ON 02/08/2024] rosuvastatin   40 mg Oral Daily   sodium chloride  flush  3 mL Intravenous Q12H    sodium chloride      magnesium sulfate bolus IVPB     sodium chloride       Physical Exam: Blood pressure 123/61, pulse 93, temperature (!) 97.3 F (36.3 C), resp. rate 16, height 5\' 11"  (1.803 m), weight 86.2 kg, SpO2 100%.    Affect appropriate Healthy:  appears stated age HEENT: normal Neck supple with no adenopathy JVP normal no bruits no thyromegaly Lungs clear with no wheezing and good diaphragmatic motion Heart:  S1/S2 no murmur, no rub, gallop or click PMI normal Abdomen: benighn, BS positve, no tenderness, no AAA no bruit.  No HSM or HJR Distal pulses intact with no bruits No edema Neuro non-focal Skin warm and dry No muscular weakness   Labs:   Lab Results  Component Value Date   WBC 8.5 02/07/2024    HGB 13.5 02/07/2024   HCT 42.2 02/07/2024   MCV 90.9 02/07/2024   PLT 221 02/07/2024    Recent Labs  Lab 02/07/24 1207  NA 134*  K 4.2  CL 104  CO2 22  BUN 32*  CREATININE 1.46*  CALCIUM  9.1  PROT 7.4  BILITOT 0.8  ALKPHOS 55  ALT 19  AST 21  GLUCOSE 285*   No results found for: "CKTOTAL", "CKMB", "CKMBINDEX", "TROPONINI"  Lab Results  Component Value Date   CHOL 164 02/12/2023   CHOL 241 (H) 12/20/2022   CHOL 281 (H) 12/17/2022   Lab Results  Component Value Date   HDL 55 02/12/2023   HDL 39 (L) 12/20/2022   HDL 43 12/17/2022   Lab Results  Component Value Date   LDLCALC 96 02/12/2023   LDLCALC 186 (H) 12/20/2022   LDLCALC 218 (H) 12/17/2022   Lab Results  Component Value Date   TRIG 67 02/12/2023   TRIG 91 12/20/2022   TRIG 114 12/17/2022   Lab Results  Component Value Date   CHOLHDL 3.0 02/12/2023   CHOLHDL 6.2 (H) 12/20/2022   CHOLHDL 6.5 (H) 12/17/2022   No results found for: "LDLDIRECT"    Radiology: DG Chest 2 View Result Date: 02/07/2024 CLINICAL DATA:  Near syncope. EXAM: CHEST - 2 VIEW COMPARISON:  None Available. FINDINGS: No focal consolidation, pleural effusion, pneumothorax. The cardiac silhouette is within normal limits. No acute osseous pathology. IMPRESSION: No active cardiopulmonary disease. Electronically Signed   By: Angus Bark M.D.   On: 02/07/2024 12:40    EKG: NSR normal    ASSESSMENT AND PLAN:   Pre syncope:  in setting of diarrhea , dehydration and history of orthostasis. Hydrate with a liter of saline. If postural VS still positive give a second liter till symptoms and  BUN/Cr normalize. Discussed using 0.1 mg Florinef  PRN for symptoms or illnesses like this. PA called outpatient script in. No evidence of acute cardiac issue. NSR normal EC no chest pain, dyspnea or palpitations and normal telemetry He has an appointment with Dr [place] in 2 weeks already  2.   CAD stent to RCA May 2023 no angina normal EF normal ECG         negative troponin ASA stable  3. Diarrhea: ? Component of irritable bowel stool check per primary service   Cardiology will sign off no need for further cardiac testing or change in medications F/U Dr [place] arranged   Signed: Janelle Mediate 02/07/2024, 4:11 PM

## 2024-02-07 NOTE — ED Provider Triage Note (Signed)
 Emergency Medicine Provider Triage Evaluation Note  Zakariyya Helfman , a 73 y.o. male  was evaluated in triage.  Pt complains of lightheadedness and nearly passing out while on the toilet early this morning with diarrhea.  Reports nausea, diarrhea x 3 days, hx of IBS symptoms  Reports hx of cardiac stent.   Denies hitting head or headache Scrape to right elbow  Review of Systems  Positive: Lightheaded, abrasion Negative: Chest pain, SOb  Physical Exam  BP 123/61 (BP Location: Left Arm)   Pulse 93   Temp (!) 97.3 F (36.3 C)   Resp 16   Ht 5\' 11"  (1.803 m)   Wt 86.2 kg   SpO2 100%   BMI 26.50 kg/m  Gen:   Awake, no distress   Resp:  Normal effort  MSK:   Moves extremities without difficulty; superficial abrasion to right elbow; full ROM of the joints and no pelvic instability; no spinal midline ttp Other:  No evidence of head injury  Medical Decision Making  Medically screening exam initiated at 11:49 AM.  Appropriate orders placed.  Daveon Arpino was informed that the remainder of the evaluation will be completed by another provider, this initial triage assessment does not replace that evaluation, and the importance of remaining in the ED until their evaluation is complete.  Syncope labs ordered, dehydration labs as well, ecg, dg chest   Arvilla Birmingham, MD 02/07/24 1151

## 2024-02-07 NOTE — ED Provider Notes (Signed)
 [name] AT [facility] Provider Note   CSN: [id number] Arrival date & time: 02/07/24  1121     History  Chief Complaint  Patient presents with   Loss of Consciousness    Ivan Dickson is a 74 y.o. male hx of CAD s/p stent, diabetes, here presenting with syncope.  Patient states that he woke up last night to use the restroom and passed out.  He states that he skinned his right elbow.  He states that he got up a couple more times at night and passed out another time.  He felt lightheaded and dizzy.  Denies any head injury.  He states that he had a stent placed in his heart for similar symptoms.  He follows with Dr. Swaziland from cardiology.  Denies any chest pain or shortness of breath.  Patient has been having some diarrhea over the last several days.  Denies any abdominal pain or vomiting  The history is provided by the patient.       Home Medications Prior to Admission medications   Medication Sig Start Date End Date Taking? Authorizing Provider  aspirin  EC 81 MG tablet Take 1 tablet (81 mg total) by mouth daily. Swallow whole. 01/22/22   Bhagat, Bhavinkumar, PA  Blood Glucose Monitoring Suppl (TRUE METRIX METER) w/Device KIT See admin instructions. 11/20/21   [provider]  Continuous Glucose Sensor (DEXCOM G7 SENSOR) MISC Use to check blood glucose levels. Replace sensor every 10 days. 02/11/23   Amin, Saad, MD  Dulaglutide (TRULICITY) 1.5 MG/0.5ML SOAJ Inject 1.5 mg into the skin once a week. 11/21/23   Amin, Saad, MD  Empagliflozin-metFORMIN  HCl ER (SYNJARDY  XR) 12.12-998 MG TB24 Take 2 tablets by mouth daily in the afternoon. 08/11/23   Amin, Saad, MD  fluticasone (FLONASE) 50 MCG/ACT nasal spray Place into both nostrils. 08/20/22   [provider]  Insulin  Degludec (TRESIBA ) 100 UNIT/ML SOLN Inject 10 Units into the skin daily in the afternoon. 08/11/23   Amin, Saad, MD  Insulin  Pen Needle (BD PEN NEEDLE NANO 2ND GEN) 32G X 4 MM MISC  Use when taking the tresiba  10 units daily 01/14/23   Amin, Saad, MD  Magnesium 400 MG CAPS Take 400 mg by mouth daily.    [provider]  pantoprazole  (PROTONIX ) 40 MG tablet Take 1 tablet (40 mg total) by mouth 2 (two) times daily. 08/11/23   Amin, Saad, MD  pioglitazone  (ACTOS ) 30 MG tablet TAKE 1 TABLET(30 MG) BY MOUTH DAILY 12/23/23   [name], MD  REPATHA  SURECLICK 140 MG/ML SOAJ ADMINISTER 1 ML UNDER THE SKIN EVERY 14 DAYS 12/24/23   Amin, Saad, MD  rosuvastatin  (CRESTOR ) 40 MG tablet Take 1 tablet (40 mg total) by mouth daily. 02/12/23   Amin, Saad, MD  TRUE METRIX BLOOD GLUCOSE TEST test strip daily. 11/20/21   [provider]      Allergies    Midodrine  and Ciprofloxacin    Review of Systems   Review of Systems  Neurological:  Positive for dizziness.  All other systems reviewed and are negative.   Physical Exam Updated Vital Signs BP 123/61 (BP Location: Left Arm)   Pulse 93   Temp (!) 97.3 F (36.3 C)   Resp 16   Ht 5\' 11"  (1.803 m)   Wt 86.2 kg   SpO2 100%   BMI 26.50 kg/m  Physical Exam Vitals and nursing note reviewed.  Constitutional:      Comments: Chronically ill but not  acutely ill.  Mildly dehydrated  HENT:     Head: Normocephalic.     Nose: Nose normal.     Mouth/Throat:     Mouth: Mucous membranes are dry.  Eyes:     Extraocular Movements: Extraocular movements intact.     Pupils: Pupils are equal, round, and reactive to light.  Cardiovascular:     Rate and Rhythm: Normal rate and regular rhythm.     Pulses: Normal pulses.     Heart sounds: Normal heart sounds.  Pulmonary:     Effort: Pulmonary effort is normal.     Breath sounds: Normal breath sounds.  Abdominal:     General: Abdomen is flat.     Palpations: Abdomen is soft.  Musculoskeletal:        General: Normal range of motion.     Cervical back: Normal range of motion and neck supple.  Skin:    General: Skin is warm.     Capillary Refill: Capillary refill takes less  than 2 seconds.  Neurological:     General: No focal deficit present.     Mental Status: He is alert and oriented to person, place, and time.  Psychiatric:        Mood and Affect: Mood normal.        Behavior: Behavior normal.     ED Results / Procedures / Treatments   Labs (all labs ordered are listed, but only abnormal results are displayed) Labs Reviewed  COMPREHENSIVE METABOLIC PANEL WITH GFR - Abnormal; Notable for the following components:      Result Value   Sodium 134 (*)    Glucose, Bld 285 (*)    BUN 32 (*)    Creatinine, Ser 1.46 (*)    GFR, Estimated 51 (*)    All other components within normal limits  MAGNESIUM - Abnormal; Notable for the following components:   Magnesium 1.4 (*)    All other components within normal limits  CBC  LIPASE, BLOOD  URINALYSIS, W/ REFLEX TO CULTURE (INFECTION SUSPECTED)  TROPONIN I (HIGH SENSITIVITY)  TROPONIN I (HIGH SENSITIVITY)    EKG EKG Interpretation Date/Time:  Saturday February 07 2024 11:58:35 EDT Ventricular Rate:  85 PR Interval:  150 QRS Duration:  72 QT Interval:  364 QTC Calculation: 433 R Axis:   41  Text Interpretation: Normal sinus rhythm Normal ECG When compared with ECG of 22-Jan-2022 14:00, PREVIOUS ECG IS PRESENT Confirmed by Jerald Molly [phone]) on 02/07/2024 12:01:27 PM  Radiology DG Chest 2 View Result Date: 02/07/2024 CLINICAL DATA:  Near syncope. EXAM: CHEST - 2 VIEW COMPARISON:  None Available. FINDINGS: No focal consolidation, pleural effusion, pneumothorax. The cardiac silhouette is within normal limits. No acute osseous pathology. IMPRESSION: No active cardiopulmonary disease. Electronically Signed   By: Angus Bark M.D.   On: 02/07/2024 12:40    Procedures Procedures    Medications Ordered in ED Medications  sodium chloride  0.9 % bolus 1,000 mL (has no administration in time range)  magnesium sulfate IVPB 2 g 50 mL (has no administration in time range)    ED Course/ Medical Decision  Making/ A&P                                 Medical Decision Making Ivan Dickson is a 73 y.o. male here presenting with syncope.  Patient had 2 episodes of syncope last night.  Consider vasovagal versus arrhythmia versus dehydration from  diarrhea.  Plan to get CBC and CMP and troponin and orthostatics.  Will hydrate and likely will need admission given 2 episodes of syncope.  3:30 PM Labs showed creatinine of 1.5.  Magnesium slightly low.  Chest x-ray is clear and troponin is negative.  Patient is orthostatic and blood pressure drops in the 90s when he stands up.  Given recurrent syncope, will admit for observation.  Discussed with Dr. Marven Slimmer from cardiology who will see patient as a consult  Problems Addressed: Orthostasis: acute illness or injury Syncope, unspecified syncope type: acute illness or injury  Amount and/or Complexity of Data Reviewed Labs: ordered. Decision-making details documented in ED Course. Radiology: ordered and independent interpretation performed. Decision-making details documented in ED Course. ECG/medicine tests: ordered and independent interpretation performed. Decision-making details documented in ED Course.  Risk Prescription drug management.    Final Clinical Impression(s) / ED Diagnoses Final diagnoses:  None    Rx / DC Orders ED Discharge Orders     None         Dalene Duck, MD 02/07/24 1531

## 2024-02-07 NOTE — H&P (Signed)
 History and Physical   Ivan Dickson ZOX:[id number] DOB: 12/26/1950 DOA: 02/07/2024  PCP: Amin, Saad, MD   Patient coming from: Home  Chief Complaint: Syncope  HPI: Ivan Dickson is a 73 y.o. male with medical history significant of hypertension, hyperlipidemia, CAD, neuropathy, GERD, gout, NAFL, chronic orthostasis presenting with syncope.  Patient woke up overnight to use the restroom and passed out to the bathroom.  Reportedly skinned his elbow.  He is awake back to bed and got up again during the night and passed out a second time.  Reported some preceding lightheadedness and dizziness.  History of prior orthostasis/dysautonomia.  Did have some diarrhea yesterday.  Reports he had some similar symptoms around the time he had a stent placed.  Denies fevers, chills, chest pain, shortness of breath, abdominal pain, constipation, nausea, vomiting.  ED Course: Vital signs in the ED notable for positive orthostatic vital signs with blood pressure drop from 121 systolic sitting to 93 systolic standing at 3 minutes.  Otherwise vital signs stable.  Lab workup included CMP with sodium 134, BUN 32, creatinine 1.46 from baseline 1.1-1.2, glucose 285.  CBC within normal limits.  Troponin normal.  Lipase negative.  Urinalysis pending.  Magnesium 1.4.  Chest x-ray showed no acute abnormality.  Previously 1 L IV fluids and 2 g IV magnesium in the ED.  Cardiology consulted considering his symptoms around previous stent placement.  Review of Systems: As per HPI otherwise all other systems reviewed and are negative.  Past Medical History:  Diagnosis Date   Aortic atherosclerosis (HCC)    CAD (coronary artery disease)    s/p DES to mid RCA 12/2021   Diabetes mellitus without complication (HCC)    Dizziness    GERD (gastroesophageal reflux disease)    High cholesterol    History of colon polyps    HTN (hypertension)    Lightheadedness    Nonadherence with dietary restriction 10/20/2017   NONCOMPLIANCE  WITH DIETARY REGIMEN; Original code:Z91.11Entered By: Oran Bimler MD; Signed By: Oran Bimler MD     Nonspecific syndrome suggestive of viral illness 09/14/2016   VIRAL SYNDROME; Original code:B97.89Entered By: Trudee Furth M.D.; Signed By: Trudee Furth M.D.      Past Surgical History:  Procedure Laterality Date   APPENDECTOMY     COLONOSCOPY     found polyps over 10 years ago   CORONARY STENT INTERVENTION N/A 01/22/2022   Procedure: CORONARY STENT INTERVENTION;  Surgeon: Swaziland, Peter M, MD;  Location: [facility] INVASIVE CV LAB;  Service: Cardiovascular;  Laterality: N/A;   ESOPHAGOGASTRODUODENOSCOPY     over 10 years ago thinks he did   HAND SURGERY Right    LEFT HEART CATH AND CORONARY ANGIOGRAPHY N/A 01/22/2022   Procedure: LEFT HEART CATH AND CORONARY ANGIOGRAPHY;  Surgeon: Swaziland, Peter M, MD;  Location: [facility] INVASIVE CV LAB;  Service: Cardiovascular;  Laterality: N/A;    Social History  reports that he has never smoked. He has never used smokeless tobacco. He reports current alcohol use. He reports that he does not currently use drugs.  Allergies  Allergen Reactions   Midodrine  Other (See Comments)    Bloating and gas   Ciprofloxacin Itching and Rash    Family History  Problem Relation Age of Onset   Hyperlipidemia Mother    Hypertension Mother    Heart disease Mother    Diabetes Mother    Stroke Father    Arrhythmia Sister    Kidney cancer Sister  renal cell cancer   Breast cancer Sister    Heart attack Brother    Heart disease Brother        CABG   Heart failure Son    Colon cancer Neg Hx    Esophageal cancer Neg Hx   Reviewed on admission  Prior to Admission medications   Medication Sig Start Date End Date Taking? Authorizing Provider  aspirin  EC 81 MG tablet Take 1 tablet (81 mg total) by mouth daily. Swallow whole. 01/22/22   Bhagat, Bhavinkumar, PA  Blood Glucose Monitoring Suppl (TRUE METRIX METER) w/Device KIT See admin instructions. 11/20/21    [provider]  Continuous Glucose Sensor (DEXCOM G7 SENSOR) MISC Use to check blood glucose levels. Replace sensor every 10 days. 02/11/23   Amin, Saad, MD  Dulaglutide (TRULICITY) 1.5 MG/0.5ML SOAJ Inject 1.5 mg into the skin once a week. 11/21/23   Amin, Saad, MD  Empagliflozin-metFORMIN  HCl ER (SYNJARDY  XR) 12.12-998 MG TB24 Take 2 tablets by mouth daily in the afternoon. 08/11/23   Amin, Saad, MD  fluticasone (FLONASE) 50 MCG/ACT nasal spray Place into both nostrils. 08/20/22   [provider]  Insulin  Degludec (TRESIBA ) 100 UNIT/ML SOLN Inject 10 Units into the skin daily in the afternoon. 08/11/23   Amin, Saad, MD  Insulin  Pen Needle (BD PEN NEEDLE NANO 2ND GEN) 32G X 4 MM MISC Use when taking the tresiba  10 units daily 01/14/23   Amin, Saad, MD  Magnesium 400 MG CAPS Take 400 mg by mouth daily.    [provider]  pantoprazole  (PROTONIX ) 40 MG tablet Take 1 tablet (40 mg total) by mouth 2 (two) times daily. 08/11/23   Amin, Saad, MD  pioglitazone  (ACTOS ) 30 MG tablet TAKE 1 TABLET(30 MG) BY MOUTH DAILY 12/23/23   [name], MD  REPATHA  SURECLICK 140 MG/ML SOAJ ADMINISTER 1 ML UNDER THE SKIN EVERY 14 DAYS 12/24/23   Amin, Saad, MD  rosuvastatin  (CRESTOR ) 40 MG tablet Take 1 tablet (40 mg total) by mouth daily. 02/12/23   Amin, Saad, MD  TRUE METRIX BLOOD GLUCOSE TEST test strip daily. 11/20/21   [provider]    Physical Exam: Vitals:   02/07/24 1134 02/07/24 1148  BP: 123/61   Pulse: 93   Resp: 16   Temp: (!) 97.3 F (36.3 C)   SpO2: 100%   Weight:  86.2 kg  Height:  5\' 11"  (1.803 m)    Physical Exam Constitutional:      General: He is not in acute distress.    Appearance: Normal appearance.  HENT:     Head: Normocephalic and atraumatic.     Mouth/Throat:     Mouth: Mucous membranes are moist.     Pharynx: Oropharynx is clear.  Eyes:     Extraocular Movements: Extraocular movements intact.     Pupils: Pupils are equal, round, and reactive  to light.  Cardiovascular:     Rate and Rhythm: Normal rate and regular rhythm.     Pulses: Normal pulses.     Heart sounds: Normal heart sounds.  Pulmonary:     Effort: Pulmonary effort is normal. No respiratory distress.     Breath sounds: Normal breath sounds.  Abdominal:     General: Bowel sounds are normal. There is no distension.     Palpations: Abdomen is soft.     Tenderness: There is no abdominal tenderness.  Musculoskeletal:        General: No swelling or deformity.  Skin:  General: Skin is warm and dry.  Neurological:     General: No focal deficit present.     Mental Status: Mental status is at baseline.    Labs on Admission: I have personally reviewed following labs and imaging studies  CBC: Recent Labs  Lab 02/07/24 1207  WBC 8.5  HGB 13.5  HCT 42.2  MCV 90.9  PLT 221   Basic Metabolic Panel: Recent Labs  Lab 02/07/24 1207  NA 134*  K 4.2  CL 104  CO2 22  GLUCOSE 285*  BUN 32*  CREATININE 1.46*  CALCIUM  9.1  MG 1.4*   GFR: Estimated Creatinine Clearance: 49.4 mL/min (A) (by C-G formula based on SCr of 1.46 mg/dL (H)).  Liver Function Tests: Recent Labs  Lab 02/07/24 1207  AST 21  ALT 19  ALKPHOS 55  BILITOT 0.8  PROT 7.4  ALBUMIN 3.8   Urine analysis: No results found for: "COLORURINE", "APPEARANCEUR", "LABSPEC", "PHURINE", "GLUCOSEU", "HGBUR", "BILIRUBINUR", "KETONESUR", "PROTEINUR", "UROBILINOGEN", "NITRITE", "LEUKOCYTESUR"  Radiological Exams on Admission: DG Chest 2 View Result Date: 02/07/2024 CLINICAL DATA:  Near syncope. EXAM: CHEST - 2 VIEW COMPARISON:  None Available. FINDINGS: No focal consolidation, pleural effusion, pneumothorax. The cardiac silhouette is within normal limits. No acute osseous pathology. IMPRESSION: No active cardiopulmonary disease. Electronically Signed   By: Angus Bark M.D.   On: 02/07/2024 12:40   EKG: Independently reviewed. Sinus rhythm at 95 bpm.  Nonspecific T wave changes.  Low voltage  aVL.  Assessment/Plan Active Problems:   CAD (coronary artery disease)   HTN (hypertension)   Dyslipidemia associated with type 2 diabetes mellitus (HCC)   Peripheral neuropathic pain   Gastroesophageal reflux disease   Gout   Syncope Orthostatic hypotension > Patient after 2 episodes of syncope when getting up in the night. > Did have some diarrhea yesterday and elevated creatinine in the ED. > Has history of orthostatic hypotension and orthostatic vital signs were positive in the ED with a drop from 121 systolic sitting to 90 systolic standing at 3 minutes. > Orthostasis is likely etiology with history of with stasis and possible.  Patient does report that he had similar symptoms around time of the stent placed in the past.  > Cardiology consulted in the ED and will see the patient. - Monitor on telemetry overnight - Appreciate cardiology recommendations and assistance - Echocardiogram - Recheck orthostatics in the morning  AKI Hypomagnesemia > Creatinine elevated to 1.46 from baseline of 1.1 in the ED.  Did have some diarrhea yesterday.  Received 1 L IV fluids in the ED. > Magnesium 1.4 in the ED.  Received 2 g IV magnesium. - Continue with home p.o. magnesium tomorrow - Trend renal function and electrolytes  Hypertension - No longer on antihypertensives  Hyperlipidemia - Continue home rosuvastatin  - On Repatha  outpatient  CAD - Continue ASA, Repatha , rosuvastatin   GERD - Continue home PPI  DVT prophylaxis: Lovenox Code Status:   Full Family Communication:  Updated at bedside  Disposition Plan:   Patient is from:  Home  Anticipated DC to:  Follow  Anticipated DC date:  1 to 2 days  Anticipated DC barriers: None  Consults called:  Cardiology Admission status:  Observation, telemetry  Severity of Illness: The appropriate patient status for this patient is OBSERVATION. Observation status is judged to be reasonable and necessary in order to provide the required  intensity of service to ensure the patient's safety. The patient's presenting symptoms, physical exam findings, and initial radiographic and laboratory  data in the context of their medical condition is felt to place them at decreased risk for further clinical deterioration. Furthermore, it is anticipated that the patient will be medically stable for discharge from the hospital within 2 midnights of admission.    Johnetta Nab MD [facility]  How to contact the [facility] Attending or Consulting provider 7A - 7P or covering provider during after hours 7P -7A, for this patient?   Check the care team in [facility] and look for a) attending/consulting [facility] provider listed and b) the [facility] team listed Log into [email or web address] and use [place]'s universal password to access. If you do not have the password, please contact the hospital operator. Locate the [facility] provider you are looking for under [facility] and page to a number that you can be directly reached. If you still have difficulty reaching the provider, please page the [facility] (Director on Call) for the Hospitalists listed on amion for assistance.  02/07/2024, 3:46 PM

## 2024-02-08 ENCOUNTER — Other Ambulatory Visit (HOSPITAL_COMMUNITY)

## 2024-02-08 DIAGNOSIS — E871 Hypo-osmolality and hyponatremia: Secondary | ICD-10-CM | POA: Diagnosis present

## 2024-02-08 DIAGNOSIS — R55 Syncope and collapse: Secondary | ICD-10-CM | POA: Diagnosis not present

## 2024-02-08 LAB — COMPREHENSIVE METABOLIC PANEL WITH GFR
ALT: 15 U/L (ref 0–44)
AST: 16 U/L (ref 15–41)
Albumin: 3 g/dL — ABNORMAL LOW (ref 3.5–5.0)
Alkaline Phosphatase: 44 U/L (ref 38–126)
Anion gap: 6 (ref 5–15)
BUN: 21 mg/dL (ref 8–23)
CO2: 22 mmol/L (ref 22–32)
Calcium: 8 mg/dL — ABNORMAL LOW (ref 8.9–10.3)
Chloride: 110 mmol/L (ref 98–111)
Creatinine, Ser: 1.3 mg/dL — ABNORMAL HIGH (ref 0.61–1.24)
GFR, Estimated: 59 mL/min — ABNORMAL LOW (ref >60)
Glucose, Bld: 211 mg/dL — ABNORMAL HIGH (ref 70–99)
Potassium: 4 mmol/L (ref 3.5–5.1)
Sodium: 138 mmol/L (ref 135–145)
Total Bilirubin: 0.8 mg/dL (ref 0.0–1.2)
Total Protein: 6.2 g/dL — ABNORMAL LOW (ref 6.5–8.1)

## 2024-02-08 LAB — CBC
HCT: 35.7 % — ABNORMAL LOW (ref 39.0–52.0)
Hemoglobin: 11.8 g/dL — ABNORMAL LOW (ref 13.0–17.0)
MCH: 29.9 pg (ref 26.0–34.0)
MCHC: 33.1 g/dL (ref 30.0–36.0)
MCV: 90.4 fL (ref 80.0–100.0)
Platelets: 189 10*3/uL (ref 150–400)
RBC: 3.95 MIL/uL — ABNORMAL LOW (ref 4.22–5.81)
RDW: 13.2 % (ref 11.5–15.5)
WBC: 4.7 10*3/uL (ref 4.0–10.5)
nRBC: 0 % (ref 0.0–0.2)

## 2024-02-08 LAB — GLUCOSE, CAPILLARY: Glucose-Capillary: 99 mg/dL (ref 70–99)

## 2024-02-08 NOTE — Discharge Summary (Signed)
 Physician Discharge Summary  Ivan Dickson ZOX:[id number] DOB: [birth year] DOA: 02/07/2024  PCP: Amin, Saad, MD  Admit date: 02/07/2024 Discharge date: 02/08/2024  Admitted From: Home Disposition:  Home  Recommendations for Outpatient Follow-up:  Follow up with PCP in 1 week Follow up with Dr. [place] of the cardiology team as originally scheduled in 2 weeks Please obtain BMP/CBC in 1 week to recheck Na, renal function, normocytic anemia. There were no results pending at time of DC.  Home Health: None  Equipment/Devices: None   Discharge Condition: Good CODE STATUS: Full  Diet recommendation: Heart healthy  Brief/Interim Summary: From H&P: From Dr. Denzel Flatness is a 73 y.o. male with medical history significant of hypertension, hyperlipidemia, CAD, neuropathy, GERD, gout, NAFL, chronic orthostasis presenting with syncope.   Patient woke up overnight to use the restroom and passed out to the bathroom.  Reportedly skinned his elbow.  He is awake back to bed and got up again during the night and passed out a second time.   Reported some preceding lightheadedness and dizziness.  History of prior orthostasis/dysautonomia.  Did have some diarrhea yesterday.  Reports he had some similar symptoms around the time he had a stent placed.   Denies fevers, chills, chest pain, shortness of breath, abdominal pain, constipation, nausea, vomiting."  Subjective on day of discharge: feeling better after fluids. Saw cardiology who did not recommend inpatient echo. Will consider getting outpatient. No signs of heart failure.   Discharge Diagnoses:  Principal Problem:   Syncope and collapse  Appreciate cardiology recs and outpt follow up   Likely 2/2 hypovolemia  Florinef  0.1 mg prn  Stay hydrated  Active Problems:   CAD (coronary artery disease)   As below    Dyslipidemia associated with type 2 diabetes mellitus (HCC)  Resume home meds  Repatha /Crestor     Gastroesophageal reflux  disease  Protonix  40 mg/d    AKI (acute kidney injury) (HCC)  Improving  Recheck on outpatient basis  Hydration encouraged as above    Hyponatremia  Resolved at DC  Discharge Instructions  Discharge Instructions     Call MD for:  persistant dizziness or light-headedness   Complete by: As directed    Diet - low sodium heart healthy   Complete by: As directed    Increase activity slowly   Complete by: As directed       Allergies as of 02/08/2024       Reactions   Midodrine  Other (See Comments)   Bloating and gas   Ciprofloxacin Itching, Rash        Medication List     TAKE these medications    Aspirin  Low Dose 81 MG tablet Generic drug: aspirin  EC Take 1 tablet (81 mg total) by mouth daily. Swallow whole.   azelastine 0.1 % nasal spray Commonly known as: ASTELIN Place 1-2 sprays into both nostrils daily as needed for rhinitis. Use in each nostril as directed   BD Pen Needle Nano 2nd Gen 32G X 4 MM Misc Generic drug: Insulin  Pen Needle Use when taking the tresiba  10 units daily   Dexcom G7 Sensor Misc Use to check blood glucose levels. Replace sensor every 10 days.   fludrocortisone  0.1 MG tablet Commonly known as: FLORINEF  Take 1 tablet (0.1 mg total) by mouth daily as needed (dizziness, orthostasis).   fluticasone 50 MCG/ACT nasal spray Commonly known as: FLONASE Place 1-2 sprays into both nostrils daily as needed for allergies or rhinitis.   LORazepam 1 MG tablet Commonly known  as: ATIVAN Take 1 tablet by mouth at bedtime.   Magnesium 400 MG Caps Take 400 mg by mouth daily.   pantoprazole  40 MG tablet Commonly known as: PROTONIX  Take 1 tablet (40 mg total) by mouth 2 (two) times daily.   pioglitazone  30 MG tablet Commonly known as: ACTOS  TAKE 1 TABLET(30 MG) BY MOUTH DAILY   Repatha  SureClick 140 MG/ML Soaj Generic drug: Evolocumab  ADMINISTER 1 ML UNDER THE SKIN EVERY 14 DAYS   rosuvastatin  40 MG tablet Commonly known as: CRESTOR  Take 1  tablet (40 mg total) by mouth daily.   Synjardy  XR 12.12-998 MG Tb24 Generic drug: Empagliflozin-metFORMIN  HCl ER Take 2 tablets by mouth daily in the afternoon.   Tresiba  FlexTouch 100 UNIT/ML FlexTouch Pen Generic drug: insulin  degludec Inject 10 Units into the skin daily. What changed: Another medication with the same name was removed. Continue taking this medication, and follow the directions you see here.   True Metrix Blood Glucose Test test strip Generic drug: glucose blood daily.   True Metrix Meter w/Device Kit See admin instructions.   Trulicity 1.5 MG/0.5ML Soaj Generic drug: Dulaglutide Inject 1.5 mg into the skin once a week.        Allergies  Allergen Reactions   Midodrine  Other (See Comments)    Bloating and gas   Ciprofloxacin Itching and Rash    Consultations: Cardiology  Procedures/Studies: DG Chest 2 View Result Date: 02/07/2024 CLINICAL DATA:  Near syncope. EXAM: CHEST - 2 VIEW COMPARISON:  None Available. FINDINGS: No focal consolidation, pleural effusion, pneumothorax. The cardiac silhouette is within normal limits. No acute osseous pathology. IMPRESSION: No active cardiopulmonary disease. Electronically Signed   By: Angus Bark M.D.   On: 02/07/2024 12:40    Discharge Exam: Vitals:   02/08/24 0351 02/08/24 0846  BP: 120/67 (!) 122/55  Pulse: 70 72  Resp: 18 18  Temp: 97.7 F (36.5 C) 97.6 F (36.4 C)  SpO2: 98% 98%   General: Pt is alert, awake, not in acute distress Cardiovascular: RRR, S1/S2 +, no edema Respiratory: CTA bilaterally, no wheezing, no rhonchi, no respiratory distress, no conversational dyspnea  Abdominal: Soft, NT, ND, bowel sounds + Extremities: no edema, no cyanosis Psych: Normal mood and affect, stable judgement and insight   The results of significant diagnostics from this hospitalization (including imaging, microbiology, ancillary and laboratory) are listed below for reference.    Labs: Basic Metabolic  Panel: Recent Labs  Lab 02/07/24 1207 02/08/24 0826  NA 134* 138  K 4.2 4.0  CL 104 110  CO2 22 22  GLUCOSE 285* 211*  BUN 32* 21  CREATININE 1.46* 1.30*  CALCIUM  9.1 8.0*  MG 1.4*  --    Liver Function Tests: Recent Labs  Lab 02/07/24 1207 02/08/24 0826  AST 21 16  ALT 19 15  ALKPHOS 55 44  BILITOT 0.8 0.8  PROT 7.4 6.2*  ALBUMIN 3.8 3.0*   Recent Labs  Lab 02/07/24 1207  LIPASE 41   CBC: Recent Labs  Lab 02/07/24 1207 02/08/24 0826  WBC 8.5 4.7  HGB 13.5 11.8*  HCT 42.2 35.7*  MCV 90.9 90.4  PLT 221 189   CBG: Recent Labs  Lab 02/08/24 0454  GLUCAP 99   Sepsis Labs Recent Labs  Lab 02/07/24 1207 02/08/24 0826  WBC 8.5 4.7   Patient was seen and examined on the day of discharge and was found to be in stable condition. Time coordinating discharge: 31 minutes including assessment and coordination of care,  as well as examination of the patient.   SIGNED:  Jobe Mulder, DO [facility] 02/08/2024, 2:14 PM

## 2024-02-08 NOTE — Discharge Instructions (Signed)
You were cared for by a hospitalist during your hospital stay. If you have any questions about your discharge medications or the care you received while you were in the hospital after you are discharged, you can call the unit and ask to speak with the hospitalist on call if the hospitalist that took care of you is not available. Once you are discharged, your primary care physician will handle any further medical issues. Please note that NO REFILLS for any discharge medications will be authorized once you are discharged, as it is imperative that you return to your primary care physician (or establish a relationship with a primary care physician if you do not have one) for your aftercare needs so that they can reassess your need for medications and monitor your lab values.  

## 2024-02-08 NOTE — Plan of Care (Signed)

## 2024-02-08 NOTE — Care Management Obs Status (Signed)
 MEDICARE OBSERVATION STATUS NOTIFICATION   Patient Details  Name: Gilman Olazabal MRN: [id number] Date of Birth: September 14, 1950   Medicare Observation Status Notification Given:  Yes    Mariangela Heldt G., RN 02/08/2024, 8:46 AM

## 2024-02-11 DIAGNOSIS — G4761 Periodic limb movement disorder: Secondary | ICD-10-CM | POA: Diagnosis not present

## 2024-02-11 DIAGNOSIS — R053 Chronic cough: Secondary | ICD-10-CM | POA: Diagnosis not present

## 2024-02-11 DIAGNOSIS — J310 Chronic rhinitis: Secondary | ICD-10-CM | POA: Diagnosis not present

## 2024-02-13 ENCOUNTER — Other Ambulatory Visit: Payer: Self-pay | Admitting: Internal Medicine

## 2024-02-18 NOTE — Progress Notes (Signed)
 Cardiology Office Note   Date:  02/20/2024   ID:  Ivan Dickson, DOB 01-12-1951, MRN [id number]  PCP:  Ivan Form, MD  Cardiologist:   Ivan Tabak [place], MD   Chief Complaint  Patient presents with   Coronary Artery Disease      History of Present Illness: Ivan Dickson is a 73 y.o. male who is seen for follow up CAD. Has history of HLD and DM type 2.   He reports over past year that he has symptoms of lightheadedness and dizziness when out working in the yard. Worse after squatting or bending over. Noted BP as low as 60/40. Was on lisinopril for renal protection but this has been discontinued. No real change in symptoms. When this happens he stops and rests. No true syncope.  Does complain of exertional pain in neck and shoulders. Did see a cardiologist a couple of years ago in [place] . Apparently had stress test, Echo, and Zio patch monitor then. Is very concerned about CV risk given risk factors of DM, HLD, and strong family history of CAD.   Subsequent coronary CTA indicated significant obstructive disease. He underwent cardiac cath demonstrating severe disease in the mid RCA that was successfully stented with DES. There was moderate disease in the diagonal branches but the LAD appeared nonobstructive (abnormal FFR on CT). He was treated with ASA and Plavix . Crestor  increased to 40 mg. Lisinopril discontinued due to dizziness. His renal parameters improved off ACEi.  In October he was seen by Ivan Pointer NP for orthostatic hypotension while at cardiac Rehab. Intolerant of midodrine  so was started on Florinef . Event monitor ordered showing brief runs of SVT otherwise benign. Patient reports Florinef  resulted in elevated BP so he quit taking it. He notes that he still has some orthostatic symptoms but he has been able to manage this. We did perform Echo which was normal. Nothing to suggest amyloid.   He was recently admitted to the hospital with syncope in the setting of diarrhea and  dehydration. Was prescribed Florinef  on DC to use PRN. He has no recurrent symptoms.   Past Medical History:  Diagnosis Date   Aortic atherosclerosis (HCC)    CAD (coronary artery disease)    s/p DES to mid RCA 12/2021   Diabetes mellitus without complication (HCC)    Dizziness    GERD (gastroesophageal reflux disease)    High cholesterol    History of colon polyps    HTN (hypertension)    Lightheadedness    Nonadherence with dietary restriction 10/20/2017   NONCOMPLIANCE WITH DIETARY REGIMEN; Original code:Z91.11Entered By: Oran Bimler MD; Signed By: Oran Bimler MD     Nonspecific syndrome suggestive of viral illness 09/14/2016   VIRAL SYNDROME; Original code:B97.89Entered By: Trudee Furth DicksonD.; Signed By: Trudee Furth DicksonD.      Past Surgical History:  Procedure Laterality Date   APPENDECTOMY     COLONOSCOPY     found polyps over 10 years ago   CORONARY STENT INTERVENTION N/A 01/22/2022   Procedure: CORONARY STENT INTERVENTION;  Surgeon: Dickson, Ivan Fellman M, MD;  Location: [facility] INVASIVE CV LAB;  Service: Cardiovascular;  Laterality: N/A;   ESOPHAGOGASTRODUODENOSCOPY     over 10 years ago thinks he did   HAND SURGERY Right    LEFT HEART CATH AND CORONARY ANGIOGRAPHY N/A 01/22/2022   Procedure: LEFT HEART CATH AND CORONARY ANGIOGRAPHY;  Surgeon: Dickson, Ivan Armas M, MD;  Location: [facility] INVASIVE CV LAB;  Service: Cardiovascular;  Laterality: N/A;     Current  Outpatient Medications  Medication Sig Dispense Refill   aspirin  EC 81 MG tablet Take 1 tablet (81 mg total) by mouth daily. Swallow whole. 90 tablet 3   azelastine (ASTELIN) 0.1 % nasal spray Place 1-2 sprays into both nostrils daily as needed for rhinitis. Use in each nostril as directed     Blood Glucose Monitoring Suppl (TRUE METRIX METER) w/Device KIT See admin instructions.     Continuous Glucose Sensor (DEXCOM G7 SENSOR) MISC Use to check blood glucose levels. Replace sensor every 10 days. 3 each 6   Dulaglutide   (TRULICITY ) 1.5 MG/0.5ML SOAJ Inject 1.5 mg into the skin once a week. 4 mL 6   Empagliflozin-metFORMIN  HCl ER (SYNJARDY  XR) 12.12-998 MG TB24 Take 2 tablets by mouth daily in the afternoon. 180 tablet 3   fludrocortisone  (FLORINEF ) 0.1 MG tablet Take 1 tablet (0.1 mg total) by mouth daily as needed (dizziness, orthostasis). 30 tablet 3   fluticasone (FLONASE) 50 MCG/ACT nasal spray Place 1-2 sprays into both nostrils daily as needed for allergies or rhinitis.     Insulin  Pen Needle (BD PEN NEEDLE NANO 2ND GEN) 32G X 4 MM MISC Use when taking the tresiba  10 units daily 100 each 6   LORazepam (ATIVAN) 1 MG tablet Take 1 tablet by mouth at bedtime.     Magnesium  400 MG CAPS Take 400 mg by mouth daily.     pantoprazole  (PROTONIX ) 40 MG tablet Take 1 tablet (40 mg total) by mouth 2 (two) times daily. 30 tablet 6   pioglitazone  (ACTOS ) 30 MG tablet TAKE 1 TABLET(30 MG) BY MOUTH DAILY 90 tablet 0   REPATHA  SURECLICK 140 MG/ML SOAJ ADMINISTER 1 ML UNDER THE SKIN EVERY 14 DAYS 2 mL 2   rosuvastatin  (CRESTOR ) 40 MG tablet TAKE 1 TABLET(40 MG) BY MOUTH DAILY 90 tablet 4   TRESIBA  FLEXTOUCH 100 UNIT/ML FlexTouch Pen Inject 10 Units into the skin daily.     TRUE METRIX BLOOD GLUCOSE TEST test strip daily.     No current facility-administered medications for this visit.    Allergies:   Midodrine  and Ciprofloxacin    Social History:  The patient  reports that he has never smoked. He has never used smokeless tobacco. He reports current alcohol use. He reports that he does not currently use drugs.   Family History:  The patient's family history includes Arrhythmia in his sister; Breast cancer in his sister; Diabetes in his mother; Heart attack in his brother; Heart disease in his brother and mother; Heart failure in his son; Hyperlipidemia in his mother; Hypertension in his mother; Kidney cancer in his sister; Stroke in his father.    ROS:  Please see the history of present illness.   Otherwise, review of  systems are positive for none.   All other systems are reviewed and negative.    PHYSICAL EXAM: VS:  BP 116/60 (BP Location: Left Arm, Patient Position: Sitting, Cuff Size: Normal)   Pulse 83   Ht 5' 11 (1.803 m)   Wt 198 lb (89.8 kg)   SpO2 96%   BMI 27.62 kg/m  , BMI Body mass index is 27.62 kg/m. Gen: Well nourished, well developed, in no acute distress HEENT: normal Neck: no JVD, carotid bruits, or masses Cardiac: RRR; no murmurs, rubs, or gallops,no edema. No radial site hematoma Respiratory:  clear to auscultation bilaterally, normal work of breathing GI: soft, nontender, nondistended, + BS MS: no deformity or atrophy Skin: warm and dry, no rash Neuro:  Strength  and sensation are intact Psych: euthymic mood, full affect   EKG:  EKG is not ordered today.    Recent Labs: 02/07/2024: Magnesium  1.4 02/08/2024: ALT 15; BUN 21; Creatinine, Ser 1.30; Hemoglobin 11.8; Platelets 189; Potassium 4.0; Sodium 138    Lipid Panel    Component Value Date/Time   CHOL 164 02/12/2023 0926   TRIG 67 02/12/2023 0926   HDL 55 02/12/2023 0926   CHOLHDL 3.0 02/12/2023 0926   LDLCALC 96 02/12/2023 0926      Wt Readings from Last 3 Encounters:  02/20/24 198 lb (89.8 kg)  02/07/24 190 lb (86.2 kg)  11/21/23 199 lb 2 oz (90.3 kg)      Other studies Reviewed: Additional studies/ records that were reviewed today include:   ADDENDUM REPORT: 01/09/2022 15:20   CLINICAL DATA:  41M with exertional shoulder pain   EXAM: Cardiac/Coronary CTA   TECHNIQUE: The patient was scanned on a [employer].   FINDINGS: A 100 kV prospective scan was triggered in the descending thoracic aorta at 111 HU's. Axial non-contrast 3 mm slices were carried out through the heart. The data set was analyzed on a dedicated work station and scored using the Agatson method. Gantry rotation speed was 250 msecs and collimation was .6 mm. 0.8 mg of sl NTG was given. The 3D data set was reconstructed  in 5% intervals of the 35-75% of the R-R cycle. Phases were analyzed on a dedicated work station using MPR, MIP and VRT modes. The patient received 80 cc of contrast.   Coronary Arteries:  Normal coronary origin.  Right dominance.   RCA is a large dominant artery that gives rise to PDA and PLA. Calcified plaque in the proximal RCA causes 50-69% stenosis. Mixed plaque in the mid RCA causes 70-99% stenosis. Calcified plaque in the distal RCA causes 25-49% stenosis.   Left main is a large artery that gives rise to LAD and LCX arteries.   LAD is a large vessel. Mixed plaque in the proximal LAD causes 50-69% stenosis.   LCX is a non-dominant artery that gives rise to one large OM1 branch.   Other findings:   Left Ventricle: Normal size   Left Atrium: Normal size. PFO   Pulmonary Veins: Normal configuration   Right Ventricle: Mild dilatation   Right Atrium: Normal size   Cardiac valves: Mild AV calcifications   Thoracic aorta: Normal size   Pulmonary Arteries: Normal size   Systemic Veins: Normal drainage   Pericardium: Normal thickness   IMPRESSION: 1. Coronary calcium  score of 1129. This was 88th percentile for age and sex matched control.   2.  Normal coronary origin with right dominance.   3.  Obstructive CAD   4.  Mixed plaque in mid RCA causes severe (70-99%) stenosis   5. Mixed plaque in the proximal LAD causes moderate (50-69%) stenosis.   6.  Will send for CTFFR   CAD-RADS 4 Severe stenosis. (70-99% or > 50% left main). Cardiac catheterization or CT FFR is recommended. Consider symptom-guided anti-ischemic pharmacotherapy as well as risk factor modification per guideline directed care.     Electronically Signed   By: Carson Clara DicksonD.   On: 01/09/2022 15:20    Addended by Wendie Hamburg, MD on 01/09/2022  3:23 PM   Study Result  Narrative & Impression  EXAM: OVER-READ INTERPRETATION  CT CHEST   The following report is an  over-read performed by radiologist Dr. Tama Fails of [facility] on 01/09/2022. This  over-read does not include interpretation of cardiac or coronary anatomy or pathology. The coronary calcium  score/coronary CTA interpretation by the cardiologist is attached.   COMPARISON:  None Available.   FINDINGS: Vascular: Aortic atherosclerosis. No acute noncardiac vascular finding.   Mediastinum/Nodes: No pathologically enlarged mediastinal or hilar lymph nodes within the visualized portions of the thorax. Distal esophagus is grossly unremarkable.   Lungs/Pleura: Right lower lobe calcified granuloma. Within the visualized portions of the thorax there are no suspicious pulmonary nodules or masses and no pleural effusion or pneumothorax. Hypoventilatory change in the dependent lungs.   Upper Abdomen: No acute abnormality.   Musculoskeletal: Thoracic spondylosis.   IMPRESSION: No acute noncardiac finding.   Aortic Atherosclerosis (ICD10-I70.0).   Electronically Signed: By: Tama Fails DicksonD. On: 01/09/2022 11:49    FFRCT ANALYSIS   FINDINGS: FFRct analysis was performed on the original cardiac CT angiogram dataset. Diagrammatic representation of the FFRct analysis is provided in a separate PDF document in PACS. This dictation was created using the PDF document and an interactive 3D model of the results. 3D model is not available in the EMR/PACS. Normal FFR range is >0.80.   1. Left Main: No significant stenosis   2. LAD: CTFFR 0.76 across lesion in proximal LAD suggesting lesion is hemodynamically significant   3. LCX: No significant stenosis   4. RCA: CTFFR 0.68 across lesion in mid RCA, suggesting lesion is hemodynamically significant   IMPRESSION: 1.  CT FFR suggests obstructive CAD in proximal LAD and mid RCA   2.  Cardiac catheterization recommended     Electronically Signed   By: Carson Clara DicksonD.   On: 01/10/2022 00:42   Cardiac  cath/PCI 01/22/22: Procedures  CORONARY STENT INTERVENTION  LEFT HEART CATH AND CORONARY ANGIOGRAPHY   Conclusion      Prox LAD to Mid LAD lesion is 50% stenosed.   1st Diag lesion is 70% stenosed.   2nd Diag lesion is 70% stenosed.   Prox RCA to Mid RCA lesion is 60% stenosed.   Mid RCA lesion is 90% stenosed.   A drug-eluting stent was successfully placed using a SYNERGY XD [id number].   Post intervention, there is a 0% residual stenosis.   Post intervention, there is a 0% residual stenosis.   The left ventricular systolic function is normal.   LV end diastolic pressure is normal.   The left ventricular ejection fraction is 55-65% by visual estimate.   There is no mitral valve regurgitation.   Single vessel obstructive CAD Normal LV function Normal LVEDP Successful PCI of the mid RCA with DES x 1   Plan: the proximal LAD does not appear obstructive on Cath ( FFR abnormal on CTA). Will treat medically unless he has significant angina. Continue DAPT for at least 6 months. Anticipate same day DC.   Coronary Diagrams  Diagnostic Dominance: Right Intervention  Implants   Event monitor 07/10/22:   Normal sinus rhythm   Few brief runs of SVT - 16 total. longest lasting 12 beat.   Otherwise rare ectopy   No Afib.     Patch Wear Time:  13 days and 20 hours (2023-10-21T11:50:23-0400 to 2023-11-04T08:36:20-0400)   Patient had a min HR of 50 bpm, max HR of 176 bpm, and avg HR of 73 bpm. Predominant underlying rhythm was Sinus Rhythm. 16 Supraventricular Tachycardia runs occurred, the run with the fastest interval lasting 4 beats with a max rate of 176 bpm, the  longest lasting 12 beats with an avg rate  of 90 bpm. Isolated SVEs were rare (<1.0%), SVE Couplets were rare (<1.0%), and SVE Triplets were rare (<1.0%). Isolated VEs were rare (<1.0%, 74), VE Triplets were rare (<1.0%, 1), and no VE Couplets were  present.   Echo 08/22/22: IMPRESSIONS     1. Left ventricular ejection  fraction, by estimation, is 65 to 70%. The  left ventricle has normal function. The left ventricle has no regional  wall motion abnormalities. There is mild concentric left ventricular  hypertrophy. Left ventricular diastolic  parameters are consistent with Grade I diastolic dysfunction (impaired  relaxation).   2. Right ventricular systolic function is normal. The right ventricular  size is normal. There is normal pulmonary artery systolic pressure.   3. Left atrial size was moderately dilated.   4. The mitral valve is normal in structure. Trivial mitral valve  regurgitation. No evidence of mitral stenosis.   5. The aortic valve is tricuspid. Aortic valve regurgitation is not  visualized. No aortic stenosis is present.   6. The inferior vena cava is normal in size with greater than 50%  respiratory variability, suggesting right atrial pressure of 3 mmHg.   ASSESSMENT AND PLAN:  1.  CAD s/p DES of the mid RCA in May 2023.   LAD did not appear to be obstructive on cath. He is asymptomatic. Continue ASA and focus on optimizing lipid and diabetic control   2. Dysautonomia with significant orthostatic hypotension. Suspect this is related to his diabetes. No evidence of amyloid on Echo.  On no antihypertensives now. Recent episode of syncope exacerbated by diarrhea and dehydration.    3. Hyperlipidemia. On  Repatha . LDL dropped from 186 to 96. Continue current therapy and heart healthy diet. Due for follow up labs with PCP   4.DM per PCP  5. CKD stage 3a   Current medicines are reviewed at length with the patient today.  The patient does not have concerns regarding medicines.  The following changes have been made:  no change     Disposition:   FU with me in one year  Signed, Tarrence Enck [place], MD  02/20/2024 9:48 AM    [facility] [address] [place], [place], [address] Phone [phone], Fax [phone]

## 2024-02-20 ENCOUNTER — Ambulatory Visit: Attending: Cardiology | Admitting: Cardiology

## 2024-02-20 ENCOUNTER — Encounter: Payer: Self-pay | Admitting: Cardiology

## 2024-02-20 VITALS — BP 116/60 | HR 83 | Ht 71.0 in | Wt 198.0 lb

## 2024-02-20 DIAGNOSIS — I951 Orthostatic hypotension: Secondary | ICD-10-CM | POA: Diagnosis not present

## 2024-02-20 DIAGNOSIS — E785 Hyperlipidemia, unspecified: Secondary | ICD-10-CM | POA: Diagnosis not present

## 2024-02-20 DIAGNOSIS — I2510 Atherosclerotic heart disease of native coronary artery without angina pectoris: Secondary | ICD-10-CM

## 2024-02-20 DIAGNOSIS — E119 Type 2 diabetes mellitus without complications: Secondary | ICD-10-CM | POA: Diagnosis not present

## 2024-02-20 NOTE — Patient Instructions (Signed)
 Medication Instructions:  Continue same medications *If you need a refill on your cardiac medications before your next appointment, please call your pharmacy*  Lab Work: None ordered  Testing/Procedures: None ordered  Follow-Up: At [facility], you and your health needs are our priority.  As part of our continuing mission to provide you with exceptional heart care, our providers are all part of one team.  This team includes your primary Cardiologist (physician) and Advanced Practice Providers or APPs (Physician Assistants and Nurse Practitioners) who all work together to provide you with the care you need, when you need it.  Your next appointment:  1 year     Call in March to schedule June appointment    Provider:  Dr.Jordan   We recommend signing up for the patient portal called MyChart.  Sign up information is provided on this After Visit Summary.  MyChart is used to connect with patients for Virtual Visits (Telemedicine).  Patients are able to view lab/test results, encounter notes, upcoming appointments, etc.  Non-urgent messages can be sent to your provider as well.   To learn more about what you can do with MyChart, go to [email or web address].

## 2024-02-23 ENCOUNTER — Encounter: Payer: Self-pay | Admitting: Internal Medicine

## 2024-02-23 ENCOUNTER — Ambulatory Visit: Admitting: Internal Medicine

## 2024-02-23 VITALS — BP 120/70 | HR 86 | Resp 18 | Ht 71.0 in | Wt 193.5 lb

## 2024-02-23 DIAGNOSIS — I951 Orthostatic hypotension: Secondary | ICD-10-CM

## 2024-02-23 DIAGNOSIS — I25118 Atherosclerotic heart disease of native coronary artery with other forms of angina pectoris: Secondary | ICD-10-CM | POA: Diagnosis not present

## 2024-02-23 DIAGNOSIS — G479 Sleep disorder, unspecified: Secondary | ICD-10-CM | POA: Diagnosis not present

## 2024-02-23 DIAGNOSIS — E1169 Type 2 diabetes mellitus with other specified complication: Secondary | ICD-10-CM

## 2024-02-23 DIAGNOSIS — E785 Hyperlipidemia, unspecified: Secondary | ICD-10-CM

## 2024-02-23 DIAGNOSIS — R55 Syncope and collapse: Secondary | ICD-10-CM | POA: Diagnosis not present

## 2024-02-23 NOTE — Assessment & Plan Note (Signed)
 I believe he will benefit from SGL-2 inhibitor for protein urea and G LP 1 agonist for cardiorenal protection. Also our goal is to wean him from Tresiba  to prevent hypoglycemia.

## 2024-02-23 NOTE — Progress Notes (Signed)
 Office Visit  Subjective   Patient ID: Ivan Dickson   DOB: 12-30-1950   Age: 73 y.o.   MRN: [id number]   Chief Complaint Chief Complaint  Patient presents with   Coronary Artery Disease    3 month follow up     History of Present Illness The patient is a 73 year old Caucasian/White male who is here for follow up.   He was admitted to hospital on June 7 because of syncopal episodes.  He was seen by cardiologist after that for follow-up.  He has significant orthostatic hypotension.  He says that he check his blood pressure at home sitting and standing and it has dropped from 100-60 on standing.  He get dizzy and lightheaded.  He was given prescription of midodrine  but he have not started that yet.  He does not take any blood pressure medication.  He has significant coronary artery disease with stent placement and he has a CT    He has diabetes mellitus and he take Tresiba  10 units daily, Synjardy  12.12/998 mg 2 tablets daily, Trulicity  1.5 mg weekly and Actos  30 mg daily.  I have discussed with him that he will  benefit from SGL- 2 inhibitor because of microalbumin/creat ratio of 57 on 09/2022 and 7 after he was started on Synjajardy on 02/2023. His GFR was 57 and 59 on two last blood draw was.  This make it CKD 3a. I believe that he will also benefit from G LP 1 agonist for cardiorenal protection particularly with GFR of 59 and very high calcium  burden on coronary CT scan. He has angioplasty and stent placed in the past.  He says that his insurance company does not wanted to cover these medications.  He brought [facility] that [facility] from our office. No hypoglycemia.  His last hemoglobin A1c was 7.2 on February 12, 2023. He has not seen eye doctor and want referral to be send to [facility] in [place]. He specifically denies chest pain, shortness of breath, unexplained fatigue, palpitations or racing heartbeats, syncopal episodes, unexplained abdominal pain, nausea or vomiting.     The patient is a 73 year old Caucasian/White male, who presents for the follow-up evaluation of GERD. Since the last visit the patient is doing well with no new complaints. he continues on Pantoprazole  which is effective for mgt.     His Coronary calcium  score was 1129.  He is due for lipid panel and CMP today.     Past Medical History Past Medical History:  Diagnosis Date   Aortic atherosclerosis (HCC)    CAD (coronary artery disease)    s/p DES to mid RCA 12/2021   Diabetes mellitus without complication (HCC)    Dizziness    GERD (gastroesophageal reflux disease)    High cholesterol    History of colon polyps    HTN (hypertension)    Lightheadedness    Nonadherence with dietary restriction 10/20/2017   NONCOMPLIANCE WITH DIETARY REGIMEN; Original code:Z91.11Entered By: Geofm Polite MD; Signed By: Geofm Polite MD     Nonspecific syndrome suggestive of viral illness 09/14/2016   VIRAL SYNDROME; Original code:B97.89Entered By: Norleen Nicolas M.D.; Signed By: Norleen Nicolas M.D.       Allergies Allergies  Allergen Reactions   Midodrine  Other (See Comments)    Bloating and gas   Ciprofloxacin Itching and Rash     Review of Systems Review of Systems  Constitutional: Negative.   HENT: Negative.  Respiratory: Negative.    Cardiovascular: Negative.   Gastrointestinal: Negative.   Neurological:  Positive for dizziness.       Objective:    Vitals BP 120/70 (BP Location: Left Arm, Patient Position: Sitting, Cuff Size: Normal)   Pulse 86   Resp 18   Ht 5' 11 (1.803 m)   Wt 193 lb 8 oz (87.8 kg)   SpO2 99%   BMI 26.99 kg/m    Physical Examination Physical Exam Constitutional:      Appearance: Normal appearance.  HENT:     Head: Normocephalic and atraumatic.   Cardiovascular:     Rate and Rhythm: Normal rate and regular rhythm.     Heart sounds: Normal heart sounds.  Pulmonary:     Effort: Pulmonary effort is normal.     Breath sounds: Normal breath  sounds.  Abdominal:     Palpations: Abdomen is soft.   Neurological:     General: No focal deficit present.     Mental Status: He is alert and oriented to person, place, and time.        Assessment & Plan:   Orthostatic hypotension   I have advised him to drink salt in water and take midodrine  tablet to prevent syncopal episode.  CAD (coronary artery disease)   He denies any exertional chest pain and he will continue to follow with cardiologist.  Dyslipidemia associated with type 2 diabetes mellitus (HCC)   I believe he will benefit from SGL-2 inhibitor for protein urea and G LP 1 agonist for cardiorenal protection. Also our goal is to wean him from Tresiba  to prevent hypoglycemia.  Syncope and collapse   Due to orthostatic hypotension, he will drink more water with salt and take midodrine .  Restless sleeper   He take lorazepam 1 mg at bedtime.    Return in about 1 month (around 03/24/2024).   Roetta Dare, MD

## 2024-02-23 NOTE — Assessment & Plan Note (Signed)
 He take lorazepam 1 mg at bedtime.

## 2024-02-23 NOTE — Assessment & Plan Note (Signed)
He denies any exertional chest pain and he will continue to follow with cardiologist.

## 2024-02-23 NOTE — Assessment & Plan Note (Signed)
 Due to orthostatic hypotension, he will drink more water with salt and take midodrine .

## 2024-02-23 NOTE — Assessment & Plan Note (Signed)
 I have advised him to drink salt in water and take midodrine  tablet to prevent syncopal episode.

## 2024-02-24 LAB — MICROALBUMIN / CREATININE URINE RATIO
Creatinine, Urine: 99.7 mg/dL
Microalb/Creat Ratio: 15 mg/g{creat} (ref 0–29)
Microalbumin, Urine: 15.4 ug/mL

## 2024-02-24 LAB — CMP14 + ANION GAP
ALT: 15 IU/L (ref 0–44)
AST: 17 IU/L (ref 0–40)
Albumin: 4.1 g/dL (ref 3.8–4.8)
Alkaline Phosphatase: 92 IU/L (ref 44–121)
Anion Gap: 22 mmol/L — ABNORMAL HIGH (ref 10.0–18.0)
BUN/Creatinine Ratio: 21 (ref 10–24)
BUN: 23 mg/dL (ref 8–27)
Bilirubin Total: 0.3 mg/dL (ref 0.0–1.2)
CO2: 16 mmol/L — ABNORMAL LOW (ref 20–29)
Calcium: 9.4 mg/dL (ref 8.6–10.2)
Chloride: 104 mmol/L (ref 96–106)
Creatinine, Ser: 1.12 mg/dL (ref 0.76–1.27)
Globulin, Total: 3 g/dL (ref 1.5–4.5)
Glucose: 167 mg/dL — ABNORMAL HIGH (ref 70–99)
Potassium: 4.8 mmol/L (ref 3.5–5.2)
Sodium: 142 mmol/L (ref 134–144)
Total Protein: 7.1 g/dL (ref 6.0–8.5)
eGFR: 70 mL/min/{1.73_m2} (ref >59)

## 2024-02-24 LAB — HEMOGLOBIN A1C
Est. average glucose Bld gHb Est-mCnc: 192 mg/dL
Hgb A1c MFr Bld: 8.3 % — ABNORMAL HIGH (ref 4.8–5.6)

## 2024-02-24 LAB — MAGNESIUM: Magnesium: 1.5 mg/dL — ABNORMAL LOW (ref 1.6–2.3)

## 2024-02-25 ENCOUNTER — Ambulatory Visit: Payer: Self-pay | Admitting: Internal Medicine

## 2024-03-15 ENCOUNTER — Other Ambulatory Visit: Payer: Self-pay | Admitting: Internal Medicine

## 2024-03-23 ENCOUNTER — Other Ambulatory Visit: Payer: Self-pay | Admitting: Internal Medicine

## 2024-03-24 ENCOUNTER — Ambulatory Visit: Admitting: Internal Medicine

## 2024-03-24 ENCOUNTER — Encounter: Payer: Self-pay | Admitting: Internal Medicine

## 2024-03-24 VITALS — BP 122/70 | HR 75 | Temp 97.2°F | Resp 18 | Ht 71.0 in | Wt 194.5 lb

## 2024-03-24 DIAGNOSIS — I25118 Atherosclerotic heart disease of native coronary artery with other forms of angina pectoris: Secondary | ICD-10-CM | POA: Diagnosis not present

## 2024-03-24 DIAGNOSIS — G479 Sleep disorder, unspecified: Secondary | ICD-10-CM | POA: Diagnosis not present

## 2024-03-24 DIAGNOSIS — E1169 Type 2 diabetes mellitus with other specified complication: Secondary | ICD-10-CM | POA: Diagnosis not present

## 2024-03-24 DIAGNOSIS — F515 Nightmare disorder: Secondary | ICD-10-CM | POA: Diagnosis not present

## 2024-03-24 DIAGNOSIS — I951 Orthostatic hypotension: Secondary | ICD-10-CM

## 2024-03-24 DIAGNOSIS — E785 Hyperlipidemia, unspecified: Secondary | ICD-10-CM | POA: Diagnosis not present

## 2024-03-24 MED ORDER — BD PEN NEEDLE NANO 2ND GEN 32G X 4 MM MISC: 6 refills | Status: AC

## 2024-03-24 MED ORDER — FLUDROCORTISONE ACETATE 0.1 MG PO TABS: 0.1000 mg | ORAL_TABLET | Freq: Two times a day (BID) | ORAL | 3 refills | Status: DC

## 2024-03-24 MED ORDER — DEXCOM G7 SENSOR MISC: 6 refills | Status: AC

## 2024-03-24 MED ORDER — PIOGLITAZONE HCL 30 MG PO TABS: 30.0000 mg | ORAL_TABLET | Freq: Every day | ORAL | 4 refills | Status: AC

## 2024-03-24 MED ORDER — PRAZOSIN HCL 1 MG PO CAPS: 1.0000 mg | ORAL_CAPSULE | Freq: Every day | ORAL | 6 refills | Status: DC

## 2024-03-24 MED ORDER — TRULICITY 3 MG/0.5ML ~~LOC~~ SOAJ: 3.0000 mg | SUBCUTANEOUS | 6 refills | Status: AC

## 2024-03-24 NOTE — Assessment & Plan Note (Signed)
 stable

## 2024-03-24 NOTE — Progress Notes (Signed)
 Office Visit  Subjective   Patient ID: Ivan Dickson   DOB: 1951/01/19   Age: 73 y.o.   MRN: [id number]   Chief Complaint Chief Complaint  Patient presents with   Coronary Artery Disease    1 month follow up     History of Present Illness The patient is a 73 year old Caucasian/White male who is here for follow up with his wife.  He says that he started drinking more water like Gatorade with 0 sugar and he take fludrocortisone  twice a day.  His orthostatic hypotension is better and he does not get that much dizzy episodes.    He has coronary artery disease and he follows with cardiologist and I have reviewed their notes.  He denies any chest pain or shortness of breath.    He has diabetes mellitus and he take Tresiba  10 units daily, Synjardy  12.12/998 mg 2 tablets daily, Trulicity  1.5 mg weekly and Actos  30 mg daily.   His last hemoglobin A1c was 8.3.  He says that his blood sugar still has been high.  He does not use continues glucose monitor and only check blood sugar once a day.  He says that his fasting blood sugar stay around 160. He did not have microalbuminuria on last blood test.  He sees eye doctor every year.    The patient is a 73 year old Caucasian/White male, who presents for the follow-up evaluation of GERD. Since the last visit the patient is doing well with no new complaints. he continues on Pantoprazole  which is effective for mgt.       He has hyperlipidemia and he take rosuvastatin  40 mg daily and repeat a 3rd 150 mg twice a month.  Will repeat lipid panel on next visit.    He tells me that sometime he get hammer when he works but it does not come all the time.  His wife tells me that he has a family history of parkinsonism disease in his brother and father.    He also tells me that he gets bad dreams.  Per wife he is very restless at nighttime scream and kicks.  He has sleep study done twice but that was negative for sleep apnea.  He take lorazepam 1 mg that has but he  still gets bad dreams    Past Medical History Past Medical History:  Diagnosis Date   Aortic atherosclerosis (HCC)    CAD (coronary artery disease)    s/p DES to mid RCA 12/2021   Diabetes mellitus without complication (HCC)    Dizziness    GERD (gastroesophageal reflux disease)    High cholesterol    History of colon polyps    HTN (hypertension)    Lightheadedness    Nonadherence with dietary restriction 10/20/2017   NONCOMPLIANCE WITH DIETARY REGIMEN; Original code:Z91.11Entered By: Geofm Polite MD; Signed By: Geofm Polite MD     Nonspecific syndrome suggestive of viral illness 09/14/2016   VIRAL SYNDROME; Original code:B97.89Entered By: Norleen Nicolas M.D.; Signed By: Norleen Nicolas M.D.       Allergies Allergies  Allergen Reactions   Midodrine  Other (See Comments)    Bloating and gas   Ciprofloxacin Itching and Rash     Review of Systems Review of Systems  Constitutional: Negative.   HENT: Negative.    Respiratory: Negative.    Cardiovascular: Negative.   Gastrointestinal: Negative.   Neurological: Negative.        Objective:    Vitals BP 122/70  Pulse 75   Temp (!) 97.2 F (36.2 C)   Resp 18   Ht 5' 11 (1.803 m)   Wt 194 lb 8 oz (88.2 kg)   SpO2 98%   BMI 27.13 kg/m    Physical Examination Physical Exam Constitutional:      Appearance: Normal appearance.  HENT:     Head: Normocephalic and atraumatic.  Cardiovascular:     Rate and Rhythm: Normal rate and regular rhythm.     Heart sounds: Normal heart sounds.  Pulmonary:     Effort: Pulmonary effort is normal.     Breath sounds: Normal breath sounds.  Abdominal:     General: Bowel sounds are normal.     Palpations: Abdomen is soft.  Neurological:     General: No focal deficit present.     Mental Status: He is alert and oriented to person, place, and time.        Assessment & Plan:   Orthostatic hypotension   He will continue to drink water with salt and take fludrocortisone   twice a day  CAD (coronary artery disease)  stable  Dyslipidemia associated with type 2 diabetes mellitus (HCC)  He will continue with current treatment and will repeat hemoglobin A1c and lipid panel on next visit.  He will see eye doctor.  I will increase the dose of   Trulicity  to 3 mg once a week. I will also send continuous glucose monitor for him.  Bad dreams  I will start him on prazosin  1 mg daily at nighttime.  He will drink more water to avoid hypotension.  Restless sleeper   For now he will continue with lorazepam and I was changed that to hydroxyzine on next visit.    Return in about 3 months (around 06/24/2024).   Roetta Dare, MD

## 2024-03-24 NOTE — Assessment & Plan Note (Signed)
 He will continue to drink water with salt and take fludrocortisone  twice a day

## 2024-03-24 NOTE — Assessment & Plan Note (Signed)
 For now he will continue with lorazepam and I was changed that to hydroxyzine on next visit.

## 2024-03-24 NOTE — Assessment & Plan Note (Signed)
 He will continue with current treatment and will repeat hemoglobin A1c and lipid panel on next visit.  He will see eye doctor.  I will increase the dose of   Trulicity  to 3 mg once a week. I will also send continuous glucose monitor for him.

## 2024-03-24 NOTE — Assessment & Plan Note (Signed)
 I will start him on prazosin  1 mg daily at nighttime.  He will drink more water to avoid hypotension.

## 2024-04-09 ENCOUNTER — Ambulatory Visit: Admitting: Internal Medicine

## 2024-04-09 ENCOUNTER — Encounter: Payer: Self-pay | Admitting: Internal Medicine

## 2024-04-09 VITALS — BP 124/74 | Temp 97.9°F | Resp 18 | Ht 71.0 in | Wt 193.0 lb

## 2024-04-09 DIAGNOSIS — R197 Diarrhea, unspecified: Secondary | ICD-10-CM

## 2024-04-09 MED ORDER — LOPERAMIDE HCL 2 MG PO TABS: 2.0000 mg | ORAL_TABLET | Freq: Four times a day (QID) | ORAL | 0 refills | Status: DC | PRN

## 2024-04-09 NOTE — Progress Notes (Signed)
   Office Visit  Subjective   Patient ID: Ivan Dickson   DOB: [birth year]   Age: 73 y.o.   MRN: [id number]   Chief Complaint Chief Complaint  Patient presents with   OFFICE VISIT    Patient here for concerned about a recent bowel movement      History of Present Illness   73 years old male who is here complaining of diarrhea for last 2 weeks.  He says that he has a 2-3 loose bowel movement every day.  He denies having any mucus or blood in his stool.  No abdominal pain.  He is not taking any laxatives.  He takes 2 Synjardy .  He says that he do not wake up at nighttime for bowel movement.  Nobody is sick at home.  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    He says that his orthostatic hypotension is much better now.  Past Medical History Past Medical History:  Diagnosis Date   Aortic atherosclerosis (HCC)    CAD (coronary artery disease)    s/p DES to mid RCA 12/2021   Diabetes mellitus without complication (HCC)    Dizziness    GERD (gastroesophageal reflux disease)    High cholesterol    History of colon polyps    HTN (hypertension)    Lightheadedness    Nonadherence with dietary restriction 10/20/2017   NONCOMPLIANCE WITH DIETARY REGIMEN; Original code:Z91.11Entered By: Geofm Polite MD; Signed By: Geofm Polite MD     Nonspecific syndrome suggestive of viral illness 09/14/2016   VIRAL SYNDROME; Original code:B97.89Entered By: Norleen Nicolas M.D.; Signed By: Norleen Nicolas M.D.       Allergies Allergies  Allergen Reactions   Midodrine  Other (See Comments)    Bloating and gas   Ciprofloxacin Itching and Rash     Review of Systems Review of Systems  Constitutional: Negative.   Respiratory: Negative.    Cardiovascular: Negative.   Gastrointestinal:  Positive for diarrhea. Negative for abdominal pain.       Objective:    Vitals BP 124/74   Temp 97.9 F (36.6 C)   Resp 18   Ht 5' 11 (1.803 m)   Wt 193 lb (87.5 kg)   SpO2  98%   BMI 26.92 kg/m    Physical Examination Physical Exam Constitutional:      Appearance: Normal appearance.  Pulmonary:     Effort: Pulmonary effort is normal.     Breath sounds: Normal breath sounds.  Abdominal:     General: Bowel sounds are normal.     Palpations: Abdomen is soft.  Neurological:     Mental Status: He is alert.        Assessment & Plan:   Diarrhea   His abdomen is soft.  I have discussed with him to cut back Synjardy  1 tablets daily.  He will take 1 Imodium  after each loose stool.  He will also drink 1 glass of Gatorade.  If diarrhea is not better then will re-evaluate.    No follow-ups on file.   Roetta Dare, MD

## 2024-04-11 NOTE — Assessment & Plan Note (Signed)
 His abdomen is soft.  I have discussed with him to cut back Synjardy  1 tablets daily.  He will take 1 Imodium  after each loose stool.  He will also drink 1 glass of Gatorade.  If diarrhea is not better then will re-evaluate.

## 2024-04-13 ENCOUNTER — Other Ambulatory Visit: Payer: Self-pay | Admitting: Internal Medicine

## 2024-05-18 DIAGNOSIS — J310 Chronic rhinitis: Secondary | ICD-10-CM | POA: Diagnosis not present

## 2024-05-18 DIAGNOSIS — R053 Chronic cough: Secondary | ICD-10-CM | POA: Diagnosis not present

## 2024-05-18 DIAGNOSIS — G4761 Periodic limb movement disorder: Secondary | ICD-10-CM | POA: Diagnosis not present

## 2024-05-18 DIAGNOSIS — J069 Acute upper respiratory infection, unspecified: Secondary | ICD-10-CM | POA: Diagnosis not present

## 2024-06-18 ENCOUNTER — Encounter: Payer: Self-pay | Admitting: Internal Medicine

## 2024-06-18 ENCOUNTER — Ambulatory Visit: Admitting: Internal Medicine

## 2024-06-18 VITALS — BP 130/80 | HR 67 | Temp 98.0°F | Resp 18 | Ht 71.0 in | Wt 190.0 lb

## 2024-06-18 DIAGNOSIS — Z23 Encounter for immunization: Secondary | ICD-10-CM | POA: Diagnosis not present

## 2024-06-18 DIAGNOSIS — F515 Nightmare disorder: Secondary | ICD-10-CM

## 2024-06-18 DIAGNOSIS — E785 Hyperlipidemia, unspecified: Secondary | ICD-10-CM | POA: Diagnosis not present

## 2024-06-18 DIAGNOSIS — I25118 Atherosclerotic heart disease of native coronary artery with other forms of angina pectoris: Secondary | ICD-10-CM | POA: Diagnosis not present

## 2024-06-18 DIAGNOSIS — I951 Orthostatic hypotension: Secondary | ICD-10-CM

## 2024-06-18 DIAGNOSIS — E1169 Type 2 diabetes mellitus with other specified complication: Secondary | ICD-10-CM | POA: Diagnosis not present

## 2024-06-18 DIAGNOSIS — R6889 Other general symptoms and signs: Secondary | ICD-10-CM | POA: Insufficient documentation

## 2024-06-18 NOTE — Assessment & Plan Note (Signed)
 He has orthostatic hypotension and occasional tremors.  His brother and father both as parkinsonism disease.  He may have diabetic related orthostatic hypotension but I will and refer him to see neurologist for evaluation as well.

## 2024-06-18 NOTE — Assessment & Plan Note (Signed)
 His hemoglobin A1c 7.3 and his due for lipid panel today.  He need to see eye doctor for diabetic eye exam.  His urine microalbuminuria was negative.

## 2024-06-18 NOTE — Progress Notes (Signed)
 Office Visit  Subjective   Patient ID: Ivan Dickson   DOB: 1950/10/16   Age: 73 y.o.   MRN: [id number]   Chief Complaint Chief Complaint  Patient presents with   Follow-up    3 month     History of Present Illness The patient is a 73 year old Caucasian/White male who is here for follow up with his wife.   He says that he still get lightheaded when he stands and has passed out few times in the past.  He was seen by cardiologist and they suggested that he has diabetic neuropathy.  He says that he has family history of parkinsonism disease in his brother and sometime he does get tremor as well.  He drink a lot of water with salt and take fludrocortisone  once a day.    He has coronary artery disease and he saw cardiologist recently. He denies any chest pain or shortness of breath.    He has diabetes mellitus and he take Tresiba  10 units daily, Synjardy  12.12/998 mg 2 tablets daily, Trulicity  3 mg weekly and Actos  30 mg daily.   His last hemoglobin A1c was 8.3  On February 23, 2024 but he has labs drawn at [facility] that he brought with him and his hemoglobin A1c was 7.3.   he says that his blood sugar is better at home.  No hypoglycemia. He did not have microalbuminuria on last blood test.  He   Need to see eye doctor for diabetic eye exam.    The patient is a 73 year old Caucasian/White male, who presents for the follow-up evaluation of GERD. Since the last visit the patient is doing well with no new complaints. he continues on Pantoprazole  which is effective for mgt.       He has hyperlipidemia and he take rosuvastatin  40 mg daily and repatha  150 mg twice a month. He is fasting for lipid panel today.     He says that his bad dreams are much better with prazosin .    Past Medical History Past Medical History:  Diagnosis Date   Aortic atherosclerosis    CAD (coronary artery disease)    s/p DES to mid RCA 12/2021   Diabetes mellitus without complication (HCC)    Dizziness    GERD (gastroesophageal  reflux disease)    High cholesterol    History of colon polyps    HTN (hypertension)    Lightheadedness    Nonadherence with dietary restriction 10/20/2017   NONCOMPLIANCE WITH DIETARY REGIMEN; Original code:Z91.11Entered By: Geofm Polite MD; Signed By: Geofm Polite MD     Nonspecific syndrome suggestive of viral illness 09/14/2016   VIRAL SYNDROME; Original code:B97.89Entered By: Norleen Nicolas M.D.; Signed By: Norleen Nicolas M.D.       Allergies Allergies  Allergen Reactions   Midodrine  Other (See Comments)    Bloating and gas   Ciprofloxacin Itching and Rash     Review of Systems Review of Systems  Constitutional: Negative.   Respiratory: Negative.    Cardiovascular: Negative.   Gastrointestinal: Negative.   Psychiatric/Behavioral:  Positive for memory loss.        Objective:    Vitals BP 130/80 (BP Location: Left Arm, Patient Position: Sitting, Cuff Size: Normal)   Pulse 67   Temp 98 F (36.7 C)   Resp 18   Ht 5' 11 (1.803 m)   Wt 190 lb (86.2 kg)   SpO2 98%   BMI 26.50 kg/m    Physical Examination Physical  Exam Constitutional:      Appearance: Normal appearance.  HENT:     Head: Normocephalic and atraumatic.  Cardiovascular:     Rate and Rhythm: Normal rate and regular rhythm.     Heart sounds: Normal heart sounds.  Pulmonary:     Effort: Pulmonary effort is normal.     Breath sounds: Normal breath sounds.  Abdominal:     General: Bowel sounds are normal.     Palpations: Abdomen is soft.  Neurological:     General: No focal deficit present.     Mental Status: He is alert and oriented to person, place, and time.        Assessment & Plan:   Orthostatic hypotension   He has orthostatic hypotension and occasional tremors.  His brother and father both as parkinsonism disease.  He may have diabetic related orthostatic hypotension but I will and refer him to see neurologist for evaluation as well.  CAD (coronary artery disease)   Stable and  he follow-up with cardiologist.  Dyslipidemia associated with type 2 diabetes mellitus (HCC)   His hemoglobin A1c 7.3 and his due for lipid panel today.  He need to see eye doctor for diabetic eye exam.  His urine microalbuminuria was negative.  Forgetfulness   He says that he noticed that he is getting more forgetful.  His 1 minutes 3 word recall was 3/3. I will do TSH and B12.  I will do a mini-mental status on next visit.  Bad dreams   Better with prazosin  1 mg daily.    Return in about 3 months (around 09/18/2024) for Annual wellness examination.   Roetta Dare, MD

## 2024-06-18 NOTE — Assessment & Plan Note (Signed)
 Better with prazosin  1 mg daily.

## 2024-06-18 NOTE — Assessment & Plan Note (Signed)
 Stable and he follow-up with cardiologist.

## 2024-06-18 NOTE — Assessment & Plan Note (Signed)
 He says that he noticed that he is getting more forgetful.  His 1 minutes 3 word recall was 3/3. I will do TSH and B12.  I will do a mini-mental status on next visit.

## 2024-06-19 LAB — CMP14 + ANION GAP
ALT: 15 IU/L (ref 0–44)
AST: 19 IU/L (ref 0–40)
Albumin: 4.5 g/dL (ref 3.8–4.8)
Alkaline Phosphatase: 75 IU/L (ref 47–123)
Anion Gap: 17 mmol/L (ref 10.0–18.0)
BUN/Creatinine Ratio: 22 (ref 10–24)
BUN: 23 mg/dL (ref 8–27)
Bilirubin Total: 0.6 mg/dL (ref 0.0–1.2)
CO2: 23 mmol/L (ref 20–29)
Calcium: 9.4 mg/dL (ref 8.6–10.2)
Chloride: 105 mmol/L (ref 96–106)
Creatinine, Ser: 1.03 mg/dL (ref 0.76–1.27)
Globulin, Total: 2.8 g/dL (ref 1.5–4.5)
Glucose: 124 mg/dL — ABNORMAL HIGH (ref 70–99)
Potassium: 3.9 mmol/L (ref 3.5–5.2)
Sodium: 145 mmol/L — ABNORMAL HIGH (ref 134–144)
Total Protein: 7.3 g/dL (ref 6.0–8.5)
eGFR: 77 mL/min/1.73 (ref >59)

## 2024-06-19 LAB — TSH: TSH: 1.92 u[IU]/mL (ref 0.450–4.500)

## 2024-06-19 LAB — LIPID PANEL
Chol/HDL Ratio: 1.6 ratio (ref 0.0–5.0)
Cholesterol, Total: 92 mg/dL — ABNORMAL LOW (ref 100–199)
HDL: 58 mg/dL (ref >39)
LDL Chol Calc (NIH): 21 mg/dL (ref 0–99)
Triglycerides: 53 mg/dL (ref 0–149)
VLDL Cholesterol Cal: 13 mg/dL (ref 5–40)

## 2024-06-19 LAB — VITAMIN B12: Vitamin B-12: 264 pg/mL (ref 232–1245)

## 2024-06-21 ENCOUNTER — Other Ambulatory Visit: Payer: Self-pay | Admitting: Internal Medicine

## 2024-06-22 NOTE — Addendum Note (Signed)
 Addended by: Iness Pangilinan on: 06/22/2024 09:43 AM   Modules accepted: Orders

## 2024-06-24 ENCOUNTER — Ambulatory Visit: Admitting: Internal Medicine

## 2024-06-29 ENCOUNTER — Ambulatory Visit: Payer: Self-pay

## 2024-06-29 NOTE — Progress Notes (Signed)
 Patient called.  Patient aware.   Dr amin:His B12 level is low, He need to take B12 1000 mcg daily it is over the counter

## 2024-07-22 ENCOUNTER — Ambulatory Visit: Admitting: Dermatology

## 2024-08-12 ENCOUNTER — Encounter: Payer: Self-pay | Admitting: Internal Medicine

## 2024-08-12 ENCOUNTER — Ambulatory Visit: Admitting: Internal Medicine

## 2024-08-12 VITALS — BP 120/72 | HR 76 | Temp 97.7°F | Resp 18 | Ht 71.0 in | Wt 186.5 lb

## 2024-08-12 DIAGNOSIS — R197 Diarrhea, unspecified: Secondary | ICD-10-CM | POA: Diagnosis not present

## 2024-08-12 DIAGNOSIS — I951 Orthostatic hypotension: Secondary | ICD-10-CM

## 2024-08-12 DIAGNOSIS — E11649 Type 2 diabetes mellitus with hypoglycemia without coma: Secondary | ICD-10-CM | POA: Insufficient documentation

## 2024-08-12 NOTE — Assessment & Plan Note (Signed)
 I will stop his magnesium  supplement as that is known to cause diarrhea.

## 2024-08-12 NOTE — Assessment & Plan Note (Signed)
 He will drink plenty water.  He will take fludrocortisone  0.1 mg twice a day.

## 2024-08-12 NOTE — Assessment & Plan Note (Signed)
 I will do hemoglobin A1c and if hemoglobin A1c is too low I may stop his insulin .  He will continue to take Actos  and Trulicity .  He will also decrease the dose of Synjardy  to 1 tablets daily.

## 2024-08-12 NOTE — Progress Notes (Signed)
 Office Visit  Subjective   Patient ID: Ivan Dickson   DOB: 1950/09/15   Age: 72 y.o.   MRN: [id number]   Chief Complaint Chief Complaint  Patient presents with   Office visit    Loss of appetite  and energy for 2 weeks, and persistent diarrhea.     History of Present Illness 72 years old male is here   Complaining of diarrhea lack of appetite and not feeling good.  This morning his blood sugar was 40 and he felt lightheaded.  He says that for 2 weeks food does not taste good.  He also has diarrhea often on.  He noted that he started having more lightheadedness when he stands up.  He has a history of autonomic neuropathy  Probably related with diabetes.  He take fludrocortisone  0.1 mg 1 tablets a day  Rather than 2 tablets.  No abdominal cramps..   Past Medical History Past Medical History:  Diagnosis Date   Aortic atherosclerosis    CAD (coronary artery disease)    s/p DES to mid RCA 12/2021   Diabetes mellitus without complication (HCC)    Dizziness    GERD (gastroesophageal reflux disease)    High cholesterol    History of colon polyps    HTN (hypertension)    Lightheadedness    Nonadherence with dietary restriction 10/20/2017   NONCOMPLIANCE WITH DIETARY REGIMEN; Original code:Z91.11Entered By: Geofm Polite MD; Signed By: Geofm Polite MD     Nonspecific syndrome suggestive of viral illness 09/14/2016   VIRAL SYNDROME; Original code:B97.89Entered By: Norleen Nicolas M.D.; Signed By: Norleen Nicolas M.D.       Allergies Allergies[1]   Review of Systems Review of Systems  Constitutional: Negative.   Gastrointestinal:  Positive for diarrhea.  Neurological:  Positive for weakness.         Lightheadedness on standing       Objective:    Vitals BP 120/72 (BP Location: Left Arm, Patient Position: Sitting, Cuff Size: Normal)   Pulse 76   Temp 97.7 F (36.5 C)   Resp 18   Ht 5' 11 (1.803 m)   Wt 186 lb 8 oz (84.6 kg)   SpO2 94%   BMI 26.01 kg/m    Physical  Examination Physical Exam Constitutional:      Appearance: Normal appearance.  Cardiovascular:     Rate and Rhythm: Normal rate and regular rhythm.     Heart sounds: Normal heart sounds.  Pulmonary:     Effort: Pulmonary effort is normal.     Breath sounds: Normal breath sounds.  Abdominal:     General: Bowel sounds are normal.     Palpations: Abdomen is soft.  Neurological:     Mental Status: He is alert.        Assessment & Plan:   Hypoglycemia associated with type 2 diabetes mellitus (HCC)  I will do hemoglobin A1c and if hemoglobin A1c is too low I may stop his insulin .  He will continue to take Actos  and Trulicity .  He will also decrease the dose of Synjardy  to 1 tablets daily.  Orthostatic hypotension   He will drink plenty water.  He will take fludrocortisone  0.1 mg twice a day.  Diarrhea   I will stop his magnesium  supplement as that is known to cause diarrhea.    No follow-ups on file.   Ivan Santoli, MD      [1]  Allergies Allergen Reactions   Midodrine  Other (See Comments)  Bloating and gas   Ciprofloxacin Itching and Rash

## 2024-08-13 ENCOUNTER — Emergency Department (HOSPITAL_COMMUNITY)

## 2024-08-13 ENCOUNTER — Encounter (HOSPITAL_COMMUNITY): Payer: Self-pay

## 2024-08-13 ENCOUNTER — Other Ambulatory Visit: Payer: Self-pay

## 2024-08-13 ENCOUNTER — Emergency Department (HOSPITAL_COMMUNITY)
Admission: EM | Admit: 2024-08-13 | Discharge: 2024-08-14 | Disposition: A | Discharge status: Home / Self Care | Attending: Emergency Medicine | Admitting: Emergency Medicine

## 2024-08-13 DIAGNOSIS — R55 Syncope and collapse: Secondary | ICD-10-CM | POA: Insufficient documentation

## 2024-08-13 LAB — COMPREHENSIVE METABOLIC PANEL WITH GFR
ALT: 20 U/L (ref 0–44)
AST: 26 U/L (ref 15–41)
Albumin: 3.9 g/dL (ref 3.5–5.0)
Alkaline Phosphatase: 61 U/L (ref 38–126)
Anion gap: 10 (ref 5–15)
BUN: 18 mg/dL (ref 8–23)
CO2: 24 mmol/L (ref 22–32)
Calcium: 8.6 mg/dL — ABNORMAL LOW (ref 8.9–10.3)
Chloride: 103 mmol/L (ref 98–111)
Creatinine, Ser: 1.18 mg/dL (ref 0.61–1.24)
GFR, Estimated: >60 mL/min (ref >60)
Glucose, Bld: 300 mg/dL — ABNORMAL HIGH (ref 70–99)
Potassium: 3.6 mmol/L (ref 3.5–5.1)
Sodium: 137 mmol/L (ref 135–145)
Total Bilirubin: 0.8 mg/dL (ref 0.0–1.2)
Total Protein: 7.2 g/dL (ref 6.5–8.1)

## 2024-08-13 LAB — URINALYSIS, ROUTINE W REFLEX MICROSCOPIC
Bacteria, UA: NONE SEEN
Bilirubin Urine: NEGATIVE
Glucose, UA: >=500 mg/dL — AB
Hgb urine dipstick: NEGATIVE
Ketones, ur: NEGATIVE mg/dL
Nitrite: NEGATIVE
Protein, ur: NEGATIVE mg/dL
Specific Gravity, Urine: 1.032 — ABNORMAL HIGH (ref 1.005–1.030)
pH: 7 (ref 5.0–8.0)

## 2024-08-13 LAB — HEMOGLOBIN A1C
Est. average glucose Bld gHb Est-mCnc: 160 mg/dL
Hgb A1c MFr Bld: 7.2 % — ABNORMAL HIGH (ref 4.8–5.6)

## 2024-08-13 LAB — CBC
HCT: 40.9 % (ref 39.0–52.0)
Hemoglobin: 13.5 g/dL (ref 13.0–17.0)
MCH: 29.5 pg (ref 26.0–34.0)
MCHC: 33 g/dL (ref 30.0–36.0)
MCV: 89.5 fL (ref 80.0–100.0)
Platelets: 205 K/uL (ref 150–400)
RBC: 4.57 MIL/uL (ref 4.22–5.81)
RDW: 13.6 % (ref 11.5–15.5)
WBC: 10.3 K/uL (ref 4.0–10.5)
nRBC: 0 % (ref 0.0–0.2)

## 2024-08-13 LAB — CBG MONITORING, ED: Glucose-Capillary: 231 mg/dL — ABNORMAL HIGH (ref 70–99)

## 2024-08-13 MED ORDER — ACETAMINOPHEN 500 MG PO TABS
1000.0000 mg | ORAL_TABLET | Freq: Once | ORAL | Status: AC
  Administered 2024-08-13: 1000 mg via ORAL
  Filled 2024-08-13: qty 2

## 2024-08-13 MED ORDER — BACITRACIN ZINC 500 UNIT/GM EX OINT
TOPICAL_OINTMENT | Freq: Two times a day (BID) | CUTANEOUS | Status: DC
  Administered 2024-08-13: 1 via TOPICAL
  Filled 2024-08-13: qty 0.9

## 2024-08-13 MED ORDER — LACTATED RINGERS IV BOLUS
1000.0000 mL | Freq: Once | INTRAVENOUS | Status: AC
  Administered 2024-08-13: 1000 mL via INTRAVENOUS

## 2024-08-13 NOTE — ED Notes (Signed)
 Left elbow abrasions cleaned and Bacitracin applied. Applied gauze and wrapped up elbow with new bandages.

## 2024-08-13 NOTE — Discharge Instructions (Signed)
 Please follow-up with Dr. Jordan.  Return for new or worsening symptoms.

## 2024-08-13 NOTE — ED Provider Triage Note (Signed)
 Emergency Medicine Provider Triage Evaluation Note  Ivan Dickson , a 73 y.o. male  was evaluated in triage.  Pt complains of syncopal episode while in the shower earlier today.  Patient has been having dizzy spells off and on.  He last had a syncopal episode about 6 months ago and was diagnosed with dehydration.  Patient has been having ongoing diarrhea off and on for the last year.  He saw his primary care provider yesterday regarding dizzy spells and medications were changed: Discontinued magnesium  supplement and reduced Synjardy  to 1 tablet daily from prior 2 daily.  Denies recent N/V, fevers, chest pain, palpitations, or shortness of breath.  Patient has been having right hip pain and diffuse low back pain since the fall earlier today.  Describes the pain as a bruised feeling.  He has been ambulatory with some discomfort.  He is accompanied by a friend who states that he was limping earlier.  Review of Systems  Positive: Syncopal episode, dizziness, diarrhea Negative: N/V, fevers, chest pain, palpitations, SOB  Physical Exam  BP 115/63 (BP Location: Right Arm)   Pulse 81   Temp 97.7 F (36.5 C)   Resp 19   Ht 5' 11 (1.803 m)   Wt 81.6 kg   SpO2 98%   BMI 25.10 kg/m  Gen:   Awake, no distress   Resp:  Normal effort with clear lung sounds throughout MSK:   Moves extremities without difficulty.  Equal strength bilaterally lower extremities. Other:  Skin is warm and dry without pallor.  No reproducible pain with palpation of low back or right hip.  Medical Decision Making  Medically screening exam initiated at 12:48 PM.  Appropriate orders placed.  Akeen Ledyard was informed that the remainder of the evaluation will be completed by another provider, this initial triage assessment does not replace that evaluation, and the importance of remaining in the ED until their evaluation is complete.   Rosina Almarie LABOR, PA-C 08/13/24 1252

## 2024-08-13 NOTE — ED Triage Notes (Signed)
 Pt passed out while in the shower this morning, hx of same, said he was admitted for dehydration. Pt currently feels light headed and c.o right hip pain and lower back pain. Denies head injury as far he knows, no blood thinners.

## 2024-08-13 NOTE — ED Provider Notes (Signed)
 [facility] Emergency Department Provider Note MRN:  [id number]  Arrival date & time: 08/13/2024     Chief Complaint   Loss of Consciousness   History of Present Illness   Ivan Dickson is a 73 y.o. year-old male presents to the ED with chief complaint of syncopal episode while in the shower earlier today.  Patient has been having dizzy spells off and on.  He last had a syncopal episode about 6 months ago and was diagnosed with dehydration.  Patient has been having ongoing diarrhea off and on for the last year.  He saw his primary care provider yesterday regarding dizzy spells and medications were changed: Discontinued magnesium  supplement and reduced Synjardy  to 1 tablet daily from prior 2 daily.  Denies recent N/V, fevers, chest pain, palpitations, or shortness of breath.  Patient has been having right hip pain and diffuse low back pain since the fall earlier today.  Describes the pain as a bruised feeling.  He has been ambulatory with some discomfort.  He is accompanied by a friend who states that he was limping earlier.   States he doesn't feel like anything is broken.  Denies any swelling or bruising to his right buttock, but thinks he'll be bruised tomorrow.  He has been able to ambulate since the fall.  He doesn't recall getting dizzy.  He denies chest pain or SOB.    History provided by patient.   Review of Systems  Pertinent positive and negative review of systems noted in HPI.    Physical Exam   Vitals:   08/13/24 1931 08/13/24 2233  BP: 135/76 (!) 153/77  Pulse: 73 75  Resp: 18 17  Temp: 97.7 F (36.5 C)   SpO2: 99% 100%    CONSTITUTIONAL:  non toxic-appearing, NAD NEURO:  Alert and oriented x 3, CN 3-12 grossly intact EYES:  eyes equal and reactive ENT/NECK:  Supple, no stridor  CARDIO:  normal rate, regular rhythm, appears well-perfused  PULM:  No respiratory distress, CTAB GI/GU:  non-distended,  MSK/SPINE:  No gross deformities, no  edema, moves all extremities, no CTLS spine tenderness, deformity or step off SKIN:  no rash, abrasions to the left elbow   *Additional and/or pertinent findings included in MDM below  Diagnostic and Interventional Summary    EKG Interpretation Date/Time:  Friday August 13 2024 12:18:04 EST Ventricular Rate:  87 PR Interval:  154 QRS Duration:  78 QT Interval:  408 QTC Calculation: 490 R Axis:   35  Text Interpretation: Normal sinus rhythm Prolonged QT Abnormal ECG When compared with ECG of 07-Feb-2024 11:58, PREVIOUS ECG IS PRESENT Confirmed by Darra Chew [phone]) on 08/13/2024 9:37:24 PM       Labs Reviewed  COMPREHENSIVE METABOLIC PANEL WITH GFR - Abnormal; Notable for the following components:      Result Value   Glucose, Bld 300 (*)    Calcium  8.6 (*)    All other components within normal limits  URINALYSIS, ROUTINE W REFLEX MICROSCOPIC - Abnormal; Notable for the following components:   APPearance HAZY (*)    Specific Gravity, Urine 1.032 (*)    Glucose, UA >=500 (*)    Leukocytes,Ua TRACE (*)    All other components within normal limits  CBG MONITORING, ED - Abnormal; Notable for the following components:   Glucose-Capillary 231 (*)    All other components within normal limits  CBC    DG Pelvis Comp Min 3V  Final Result    DG Hip Unilat  With Pelvis 2-3 Views Right  Final Result      Medications  bacitracin ointment (1 Application Topical Given 08/13/24 2319)  acetaminophen  (TYLENOL ) tablet 1,000 mg (1,000 mg Oral Given 08/13/24 2310)  lactated ringers bolus 1,000 mL (1,000 mLs Intravenous New Bag/Given 08/13/24 2312)     Procedures  /  Critical Care Procedures  ED Course and Medical Decision Making  I have reviewed the triage vital signs, the nursing notes, and pertinent available records from the EMR.  Social Determinants Affecting Complexity of Care: Patient has no clinically significant social determinants affecting this chief  complaint..   ED Course: Clinical Course as of 08/13/24 2329  Fri Aug 13, 2024  2328 CBC No anemia or leukocytosis [RB]  2328 Comprehensive metabolic panel(!) Increased glucose at 300, no other significant electrolyte abnormality [RB]  2328 Urinalysis, Routine w reflex microscopic -Urine, Clean Catch(!) No evidence of infection [RB]  2329 EKG 12-Lead Mild QT prolongation, but no concerning dysrhythmias [RB]    Clinical Course User Index [RB] [name], PA-C    Medical Decision Making Patient here with syncopal event that occurred this morning.  He denies hitting his head.  Complains of some right hip pain.  Plain films are negative.  Patient is able to ambulate.  He does not feel dizzy now.  He did have a drop in his blood pressure when standing with orthostatic blood pressures, but states that this is typical for him.  He was not hypotensive during any of the blood pressure readings.  He has been having his medications adjusted for dizziness.  I reviewed his labs, EKG, recent evaluations with Dr. Darra.  Will give patient liter of fluid and anticipate discharge and follow-up with cardiology.    Amount and/or Complexity of Data Reviewed Labs: ordered.  Risk OTC drugs.         Consultants: No consultations were needed in caring for this patient.   Treatment and Plan: I considered admission due to patient's initial presentation, but after considering the examination and diagnostic results, patient will not require admission and can be discharged with outpatient follow-up.  Patient discussed with attending physician, Dr. Darra, who recommends a liter of fluid and following up with cardiology for recheck.  Feels that patient can be discharged home tonight.  Final Clinical Impressions(s) / ED Diagnoses     ICD-10-CM   1. Syncope and collapse  R55       ED Discharge Orders     None         Discharge Instructions Discussed with and Provided to Patient:      Discharge Instructions      Please follow-up with Dr. Jordan.  Return for new or worsening symptoms.       [name], PA-C 08/13/24 2329

## 2024-08-13 NOTE — ED Notes (Signed)
 Pt tolerated standing well. Had no c/o dizziness, nausea, etc.

## 2024-08-14 ENCOUNTER — Encounter: Payer: Self-pay | Admitting: Cardiology

## 2024-08-16 ENCOUNTER — Other Ambulatory Visit: Payer: Self-pay

## 2024-08-16 ENCOUNTER — Ambulatory Visit: Payer: Self-pay

## 2024-08-16 NOTE — Progress Notes (Signed)
 Patient called.  Patient aware.

## 2024-08-17 ENCOUNTER — Ambulatory Visit: Attending: Cardiology | Admitting: Cardiology

## 2024-08-17 ENCOUNTER — Other Ambulatory Visit: Payer: Self-pay | Admitting: Internal Medicine

## 2024-08-17 VITALS — BP 134/64 | HR 81 | Ht 71.0 in | Wt 189.1 lb

## 2024-08-17 DIAGNOSIS — E785 Hyperlipidemia, unspecified: Secondary | ICD-10-CM

## 2024-08-17 DIAGNOSIS — I951 Orthostatic hypotension: Secondary | ICD-10-CM

## 2024-08-17 DIAGNOSIS — I2510 Atherosclerotic heart disease of native coronary artery without angina pectoris: Secondary | ICD-10-CM

## 2024-08-17 DIAGNOSIS — I25118 Atherosclerotic heart disease of native coronary artery with other forms of angina pectoris: Secondary | ICD-10-CM

## 2024-08-17 DIAGNOSIS — E119 Type 2 diabetes mellitus without complications: Secondary | ICD-10-CM

## 2024-08-17 LAB — BASIC METABOLIC PANEL WITH GFR
BUN/Creatinine Ratio: 15 (ref 10–24)
BUN: 16 mg/dL (ref 8–27)
CO2: 22 mmol/L (ref 20–29)
Calcium: 9.1 mg/dL (ref 8.6–10.2)
Chloride: 102 mmol/L (ref 96–106)
Creatinine, Ser: 1.09 mg/dL (ref 0.76–1.27)
Glucose: 233 mg/dL — ABNORMAL HIGH (ref 70–99)
Potassium: 4 mmol/L (ref 3.5–5.2)
Sodium: 140 mmol/L (ref 134–144)
eGFR: 72 mL/min/1.73 (ref >59)

## 2024-08-17 LAB — MAGNESIUM: Magnesium: 1.7 mg/dL (ref 1.6–2.3)

## 2024-08-17 MED ORDER — ROSUVASTATIN CALCIUM 20 MG PO TABS: 20.0000 mg | ORAL_TABLET | Freq: Every day | ORAL | 3 refills | Status: AC

## 2024-08-17 NOTE — Progress Notes (Signed)
 Cardiology Office Note   Date:  08/17/2024   ID:  Ivan Dickson, DOB 1951/05/20, MRN [id number]  PCP:  Caleen Dirks, MD  Cardiologist:   Stanislawa Gaffin, MD   No chief complaint on file.     History of Present Illness: Ivan Dickson is a 73 y.o. male who is seen for follow up CAD. Has history of HLD and DM type 2.   He reports over past year that he has symptoms of lightheadedness and dizziness when out working in the yard. Worse after squatting or bending over. Noted BP as low as 60/40. Was on lisinopril for renal protection but this has been discontinued. No real change in symptoms. When this happens he stops and rests. No true syncope.  Does complain of exertional pain in neck and shoulders. Did see a cardiologist a couple of years ago in [place] . Apparently had stress test, Echo, and Zio patch monitor then. Is very concerned about CV risk given risk factors of DM, HLD, and strong family history of CAD.   Subsequent coronary CTA indicated significant obstructive disease. He underwent cardiac cath demonstrating severe disease in the mid RCA that was successfully stented with DES. There was moderate disease in the diagonal branches but the LAD appeared nonobstructive (abnormal FFR on CT). He was treated with ASA and Plavix . Crestor  increased to 40 mg. Lisinopril discontinued due to dizziness. His renal parameters improved off ACEi.  In October he was seen by Almarie Crate NP for orthostatic hypotension while at cardiac Rehab. Intolerant of midodrine  so was started on Florinef . Event monitor ordered showing brief runs of SVT otherwise benign. Patient reports Florinef  resulted in elevated BP so he quit taking it. He notes that he still has some orthostatic symptoms but he has been able to manage this. We did perform Echo which was normal. Nothing to suggest amyloid.   In June he was admitted to the hospital with syncope in the setting of diarrhea and dehydration. Was prescribed Florinef  on DC  to use PRN.   He was seen in the ED on Dec 12 with another syncopal spell. Was orthostatic. Given a liter of fluid IV and DC. QTc was prolonged at 490 msec. Potassium was 3.6. magnesium  was not checked but was low in June at 1.5. reports he was on magnesium  supplement but this was stopped due to loose stools. Also note he was placed on prazosin  in July for nightmares.   Past Medical History:  Diagnosis Date   Aortic atherosclerosis    CAD (coronary artery disease)    s/p DES to mid RCA 12/2021   Diabetes mellitus without complication (HCC)    Dizziness    GERD (gastroesophageal reflux disease)    High cholesterol    History of colon polyps    HTN (hypertension)    Lightheadedness    Nonadherence with dietary restriction 10/20/2017   NONCOMPLIANCE WITH DIETARY REGIMEN; Original code:Z91.11Entered By: Geofm Polite MD; Signed By: Geofm Polite MD     Nonspecific syndrome suggestive of viral illness 09/14/2016   VIRAL SYNDROME; Original code:B97.89Entered By: Norleen Nicolas M.D.; Signed By: Norleen Nicolas M.D.      Past Surgical History:  Procedure Laterality Date   APPENDECTOMY     COLONOSCOPY     found polyps over 10 years ago   CORONARY STENT INTERVENTION N/A 01/22/2022   Procedure: CORONARY STENT INTERVENTION;  Surgeon: Amario Longmore M, MD;  Location: [facility] INVASIVE CV LAB;  Service: Cardiovascular;  Laterality: N/A;   ESOPHAGOGASTRODUODENOSCOPY  over 10 years ago thinks he did   HAND SURGERY Right    LEFT HEART CATH AND CORONARY ANGIOGRAPHY N/A 01/22/2022   Procedure: LEFT HEART CATH AND CORONARY ANGIOGRAPHY;  Surgeon: Kaylei Frink M, MD;  Location: [facility] INVASIVE CV LAB;  Service: Cardiovascular;  Laterality: N/A;     Current Outpatient Medications  Medication Sig Dispense Refill   azelastine (ASTELIN) 0.1 % nasal spray Place 1-2 sprays into both nostrils daily as needed for rhinitis. Use in each nostril as directed     Blood Glucose Monitoring Suppl (TRUE METRIX METER)  w/Device KIT See admin instructions.     Continuous Glucose Sensor (DEXCOM G7 SENSOR) MISC Use to check blood glucose levels. Replace sensor every 10 days. 3 each 6   Dulaglutide  (TRULICITY ) 3 MG/0.5ML SOAJ Inject 3 mg into the skin once a week. 4 mL 6   Empagliflozin-metFORMIN  HCl (SYNJARDY ) 12.12-998 MG TABS Take 1 tablet by mouth daily.     fludrocortisone  (FLORINEF ) 0.1 MG tablet Take 1 tablet (0.1 mg total) by mouth 2 (two) times daily. 180 tablet 3   fluticasone (FLONASE) 50 MCG/ACT nasal spray Place 1-2 sprays into both nostrils daily as needed for allergies or rhinitis.     insulin  degludec (TRESIBA  FLEXTOUCH) 100 UNIT/ML FlexTouch Pen INJECT 10 UNITS INTO THE SKIN DAILY IN THE AFTERNOON 3 mL 6   Insulin  Pen Needle (BD PEN NEEDLE NANO 2ND GEN) 32G X 4 MM MISC Use when taking the tresiba  10 units daily 100 each 6   loperamide  (IMODIUM  A-D) 2 MG tablet Take 1 tablet (2 mg total) by mouth 4 (four) times daily as needed for diarrhea or loose stools. 30 tablet 0   LORazepam (ATIVAN) 1 MG tablet Take 1 tablet by mouth at bedtime.     pantoprazole  (PROTONIX ) 40 MG tablet TAKE 1 TABLET(40 MG) BY MOUTH TWICE DAILY 30 tablet 6   pioglitazone  (ACTOS ) 30 MG tablet Take 1 tablet (30 mg total) by mouth daily. 90 tablet 4   REPATHA  SURECLICK 140 MG/ML SOAJ ADMINISTER 1 ML UNDER THE SKIN EVERY 14 DAYS 2 mL 2   TRUE METRIX BLOOD GLUCOSE TEST test strip daily.     aspirin  EC 81 MG tablet Take 1 tablet (81 mg total) by mouth daily. Swallow whole. (Patient not taking: Reported on 08/17/2024) 90 tablet 3   No current facility-administered medications for this visit.    Allergies:   Midodrine  and Ciprofloxacin    Social History:  The patient  reports that he has never smoked. He has never used smokeless tobacco. He reports current alcohol use. He reports that he does not currently use drugs.   Family History:  The patient's family history includes Arrhythmia in his sister; Breast cancer in his sister;  Diabetes in his mother; Heart attack in his brother; Heart disease in his brother and mother; Heart failure in his son; Hyperlipidemia in his mother; Hypertension in his mother; Kidney cancer in his sister; Stroke in his father.    ROS:  Please see the history of present illness.   Otherwise, review of systems are positive for none.   All other systems are reviewed and negative.    PHYSICAL EXAM: VS:  BP 134/64 (BP Location: Left Arm, Patient Position: Sitting, Cuff Size: Normal)   Pulse 81   Ht 5' 11 (1.803 m)   Wt 189 lb 2 oz (85.8 kg)   BMI 26.38 kg/m  , BMI Body mass index is 26.38 kg/m. BP 90/52 standing.  Gen:  Well nourished, well developed, in no acute distress HEENT: normal Neck: no JVD, carotid bruits, or masses Cardiac: RRR; no murmurs, rubs, or gallops,no edema. No radial site hematoma Respiratory:  clear to auscultation bilaterally, normal work of breathing GI: soft, nontender, nondistended, + BS MS: no deformity or atrophy Skin: warm and dry, no rash Neuro:  Strength and sensation are intact Psych: euthymic mood, full affect   EKG Interpretation Date/Time:  Tuesday August 17 2024 08:59:56 EST Ventricular Rate:  81 PR Interval:  160 QRS Duration:  86 QT Interval:  378 QTC Calculation: 439 R Axis:   22  Text Interpretation: Normal sinus rhythm Normal ECG When compared with ECG of 13-Aug-2024 12:18, QT has shortened Confirmed by Washington Whedbee [phone]) on 08/17/2024 9:01:29 AM   Recent Labs: 02/23/2024: Magnesium  1.5 06/18/2024: TSH 1.920 08/13/2024: ALT 20; BUN 18; Creatinine, Ser 1.18; Hemoglobin 13.5; Platelets 205; Potassium 3.6; Sodium 137    Lipid Panel    Component Value Date/Time   CHOL 92 (L) 06/18/2024 1236   TRIG 53 06/18/2024 1236   HDL 58 06/18/2024 1236   CHOLHDL 1.6 06/18/2024 1236   LDLCALC 21 06/18/2024 1236      Wt Readings from Last 3 Encounters:  08/17/24 189 lb 2 oz (85.8 kg)  08/13/24 180 lb (81.6 kg)  08/12/24 186 lb 8 oz (84.6  kg)      Other studies Reviewed: Additional studies/ records that were reviewed today include:   ADDENDUM REPORT: 01/09/2022 15:20   CLINICAL DATA:  50M with exertional shoulder pain   EXAM: Cardiac/Coronary CTA   TECHNIQUE: The patient was scanned on a [employer].   FINDINGS: A 100 kV prospective scan was triggered in the descending thoracic aorta at 111 HU's. Axial non-contrast 3 mm slices were carried out through the heart. The data set was analyzed on a dedicated work station and scored using the Agatson method. Gantry rotation speed was 250 msecs and collimation was .6 mm. 0.8 mg of sl NTG was given. The 3D data set was reconstructed in 5% intervals of the 35-75% of the R-R cycle. Phases were analyzed on a dedicated work station using MPR, MIP and VRT modes. The patient received 80 cc of contrast.   Coronary Arteries:  Normal coronary origin.  Right dominance.   RCA is a large dominant artery that gives rise to PDA and PLA. Calcified plaque in the proximal RCA causes 50-69% stenosis. Mixed plaque in the mid RCA causes 70-99% stenosis. Calcified plaque in the distal RCA causes 25-49% stenosis.   Left main is a large artery that gives rise to LAD and LCX arteries.   LAD is a large vessel. Mixed plaque in the proximal LAD causes 50-69% stenosis.   LCX is a non-dominant artery that gives rise to one large OM1 branch.   Other findings:   Left Ventricle: Normal size   Left Atrium: Normal size. PFO   Pulmonary Veins: Normal configuration   Right Ventricle: Mild dilatation   Right Atrium: Normal size   Cardiac valves: Mild AV calcifications   Thoracic aorta: Normal size   Pulmonary Arteries: Normal size   Systemic Veins: Normal drainage   Pericardium: Normal thickness   IMPRESSION: 1. Coronary calcium  score of 1129. This was 88th percentile for age and sex matched control.   2.  Normal coronary origin with right dominance.   3.   Obstructive CAD   4.  Mixed plaque in mid RCA causes severe (70-99%) stenosis   5. Mixed  plaque in the proximal LAD causes moderate (50-69%) stenosis.   6.  Will send for CTFFR   CAD-RADS 4 Severe stenosis. (70-99% or > 50% left main). Cardiac catheterization or CT FFR is recommended. Consider symptom-guided anti-ischemic pharmacotherapy as well as risk factor modification per guideline directed care.     Electronically Signed   By: Lonni Nanas M.D.   On: 01/09/2022 15:20    Addended by Nanas Lonni CROME, MD on 01/09/2022  3:23 PM   Study Result  Narrative & Impression  EXAM: OVER-READ INTERPRETATION  CT CHEST   The following report is an over-read performed by radiologist Dr. Reyes Holder of [facility] on 01/09/2022. This over-read does not include interpretation of cardiac or coronary anatomy or pathology. The coronary calcium  score/coronary CTA interpretation by the cardiologist is attached.   COMPARISON:  None Available.   FINDINGS: Vascular: Aortic atherosclerosis. No acute noncardiac vascular finding.   Mediastinum/Nodes: No pathologically enlarged mediastinal or hilar lymph nodes within the visualized portions of the thorax. Distal esophagus is grossly unremarkable.   Lungs/Pleura: Right lower lobe calcified granuloma. Within the visualized portions of the thorax there are no suspicious pulmonary nodules or masses and no pleural effusion or pneumothorax. Hypoventilatory change in the dependent lungs.   Upper Abdomen: No acute abnormality.   Musculoskeletal: Thoracic spondylosis.   IMPRESSION: No acute noncardiac finding.   Aortic Atherosclerosis (ICD10-I70.0).   Electronically Signed: By: Reyes Holder M.D. On: 01/09/2022 11:49    FFRCT ANALYSIS   FINDINGS: FFRct analysis was performed on the original cardiac CT angiogram dataset. Diagrammatic representation of the FFRct analysis is provided in a separate PDF  document in PACS. This dictation was created using the PDF document and an interactive 3D model of the results. 3D model is not available in the EMR/PACS. Normal FFR range is >0.80.   1. Left Main: No significant stenosis   2. LAD: CTFFR 0.76 across lesion in proximal LAD suggesting lesion is hemodynamically significant   3. LCX: No significant stenosis   4. RCA: CTFFR 0.68 across lesion in mid RCA, suggesting lesion is hemodynamically significant   IMPRESSION: 1.  CT FFR suggests obstructive CAD in proximal LAD and mid RCA   2.  Cardiac catheterization recommended     Electronically Signed   By: Lonni Nanas M.D.   On: 01/10/2022 00:42   Cardiac cath/PCI 01/22/22: Procedures  CORONARY STENT INTERVENTION  LEFT HEART CATH AND CORONARY ANGIOGRAPHY   Conclusion      Prox LAD to Mid LAD lesion is 50% stenosed.   1st Diag lesion is 70% stenosed.   2nd Diag lesion is 70% stenosed.   Prox RCA to Mid RCA lesion is 60% stenosed.   Mid RCA lesion is 90% stenosed.   A drug-eluting stent was successfully placed using a SYNERGY XD [id number].   Post intervention, there is a 0% residual stenosis.   Post intervention, there is a 0% residual stenosis.   The left ventricular systolic function is normal.   LV end diastolic pressure is normal.   The left ventricular ejection fraction is 55-65% by visual estimate.   There is no mitral valve regurgitation.   Single vessel obstructive CAD Normal LV function Normal LVEDP Successful PCI of the mid RCA with DES x 1   Plan: the proximal LAD does not appear obstructive on Cath ( FFR abnormal on CTA). Will treat medically unless he has significant angina. Continue DAPT for at least 6 months. Anticipate same day DC.  Coronary Diagrams  Diagnostic Dominance: Right Intervention  Implants   Event monitor 07/10/22:   Normal sinus rhythm   Few brief runs of SVT - 16 total. longest lasting 12 beat.   Otherwise rare ectopy   No  Afib.     Patch Wear Time:  13 days and 20 hours (2023-10-21T11:50:23-0400 to 2023-11-04T08:36:20-0400)   Patient had a min HR of 50 bpm, max HR of 176 bpm, and avg HR of 73 bpm. Predominant underlying rhythm was Sinus Rhythm. 16 Supraventricular Tachycardia runs occurred, the run with the fastest interval lasting 4 beats with a max rate of 176 bpm, the  longest lasting 12 beats with an avg rate of 90 bpm. Isolated SVEs were rare (<1.0%), SVE Couplets were rare (<1.0%), and SVE Triplets were rare (<1.0%). Isolated VEs were rare (<1.0%, 74), VE Triplets were rare (<1.0%, 1), and no VE Couplets were  present.   Echo 08/22/22: IMPRESSIONS     1. Left ventricular ejection fraction, by estimation, is 65 to 70%. The  left ventricle has normal function. The left ventricle has no regional  wall motion abnormalities. There is mild concentric left ventricular  hypertrophy. Left ventricular diastolic  parameters are consistent with Grade I diastolic dysfunction (impaired  relaxation).   2. Right ventricular systolic function is normal. The right ventricular  size is normal. There is normal pulmonary artery systolic pressure.   3. Left atrial size was moderately dilated.   4. The mitral valve is normal in structure. Trivial mitral valve  regurgitation. No evidence of mitral stenosis.   5. The aortic valve is tricuspid. Aortic valve regurgitation is not  visualized. No aortic stenosis is present.   6. The inferior vena cava is normal in size with greater than 50%  respiratory variability, suggesting right atrial pressure of 3 mmHg.   ASSESSMENT AND PLAN:  1.  CAD s/p DES of the mid RCA in May 2023.   LAD did not appear to be obstructive on cath. He is asymptomatic. Continue ASA and focus on optimizing lipid and diabetic control   2. Dysautonomia with significant orthostatic hypotension. Has had 2 syncopal spells this year.  Suspect this is related to his diabetes. No evidence of amyloid on Echo.   Prazosin  is contraindicated in this situation and will discontinue. He is on Florinef . If symptomatic orthostatsis continues will add midodrine . Continue with liberal salt intake and compression hose.  3. Hyperlipidemia. On  Repatha . LDL down to 21.  Will reduce Crestor  to 20 mg daily.  4.DM per PCP. Last A1c 7.2%.   5. CKD stage 3a. Last creatinine 1.18.   6. Prolonged QT when seen in ED. Now normal. Suspect related to electrolyte imbalance. Now normal. Will recheck potassium and magnesium .  7. Nightmares. Will need to find a different solution than prazosin  given BP lowering effects.    Current medicines are reviewed at length with the patient today.  The patient does not have concerns regarding medicines.  The following changes have been made:  no change     Disposition:   FU with me in 2 months.  Signed, Roselene Gray, MD  08/17/2024 9:16 AM    [facility] [address], [place], [place], [address] Phone [phone], Fax [phone]

## 2024-08-17 NOTE — Patient Instructions (Addendum)
 Medication Instructions:  Stop Prazosin  Decrease Crestor  to 20 mg daily Continue all other nmedications *If you need a refill on your cardiac medications before your next appointment, please call your pharmacy*  Lab Work: Bmet and magnesium  today  Testing/Procedures: None ordered  Follow-Up: At [facility], you and your health needs are our priority.  As part of our continuing mission to provide you with exceptional heart care, our providers are all part of one team.  This team includes your primary Cardiologist (physician) and Advanced Practice Providers or APPs (Physician Assistants and Nurse Practitioners) who all work together to provide you with the care you need, when you need it.  Your next appointment:  2 months     Monday 2/9 at 11:20 am    Provider:  Dr.Jordan   We recommend signing up for the patient portal called MyChart.  Sign up information is provided on this After Visit Summary.  MyChart is used to connect with patients for Virtual Visits (Telemedicine).  Patients are able to view lab/test results, encounter notes, upcoming appointments, etc.  Non-urgent messages can be sent to your provider as well.   To learn more about what you can do with MyChart, go to [email or web address].

## 2024-08-18 ENCOUNTER — Ambulatory Visit: Payer: Self-pay | Admitting: Cardiology

## 2024-08-19 ENCOUNTER — Other Ambulatory Visit: Payer: Self-pay | Admitting: Internal Medicine

## 2024-08-19 ENCOUNTER — Other Ambulatory Visit: Payer: Self-pay

## 2024-09-06 ENCOUNTER — Ambulatory Visit: Admitting: Dermatology

## 2024-09-06 ENCOUNTER — Encounter: Payer: Self-pay | Admitting: Dermatology

## 2024-09-06 ENCOUNTER — Other Ambulatory Visit: Payer: Self-pay | Admitting: Internal Medicine

## 2024-09-06 VITALS — BP 129/69

## 2024-09-06 DIAGNOSIS — L219 Seborrheic dermatitis, unspecified: Secondary | ICD-10-CM | POA: Diagnosis not present

## 2024-09-06 DIAGNOSIS — R238 Other skin changes: Secondary | ICD-10-CM | POA: Diagnosis not present

## 2024-09-06 MED ORDER — KETOCONAZOLE 2 % EX CREA: 1.0000 | TOPICAL_CREAM | Freq: Two times a day (BID) | CUTANEOUS | 5 refills | Status: AC

## 2024-09-06 NOTE — Patient Instructions (Addendum)
 VISIT SUMMARY:  During today's visit, we discussed the persistent irritation on your right cheek, which has been present for several months. 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 We also examined the flaky areas on your nose and scalp.  YOUR PLAN:  -SEBORRHEIC DERMATITIS:  Seborrheic dermatitis is a common skin condition that causes redness and flakiness, often due to yeast overgrowth in oil glands. You should apply ketoconazole  cream twice daily until the irritation resolves, which typically takes 1-3 weeks. Refills for the cream have been provided for future flare-ups. Additionally, use a light moisturizer like CeraVe to keep your skin hydrated, especially during the winter months.  -SKIN LESION, RIGHT MANDIBULAR JAWLINE:  There is pronounced inflammation on your right mandibular jawline, which may be related to seborrheic dermatitis or could be a separate issue. We will re-evaluate this area in three months and may perform a biopsy if necessary. Please take a picture of the area for future reference. A full skin cancer screening will also be conducted at your follow-up appointment.  INSTRUCTIONS:  Please schedule a follow-up appointment in three months for re-evaluation of the skin lesion on your right mandibular jawline and a full skin cancer screening. Important Information  Due to recent changes in healthcare laws, you may see results of your pathology and/or laboratory studies on MyChart before the doctors have had a chance to review them. We understand that in some cases there may be results that are confusing or concerning to you. Please understand that not all results are received at the same time and often the doctors may need to interpret multiple results in order to provide you with the best plan of care or course of treatment. Therefore, we ask that you please give us  2 business days to thoroughly review all your results before contacting the  office for clarification. Should we see a critical lab result, you will be contacted sooner.   If You Need Anything After Your Visit  If you have any questions or concerns for your doctor, please call our main line at [phone] If no one answers, please leave a voicemail as directed and we will return your call as soon as possible. Messages left after 4 pm will be answered the following business day.   You may also send us  a message via MyChart. We typically respond to MyChart messages within 1-2 business days.  For prescription refills, please ask your pharmacy to contact our office. Our fax number is [phone].  If you have an urgent issue when the clinic is closed that cannot wait until the next business day, you can page your doctor at the number below.    Please note that while we do our best to be available for urgent issues outside of office hours, we are not available 24/7.   If you have an urgent issue and are unable to reach us , you may choose to seek medical care at your doctor's office, retail clinic, urgent care center, or emergency room.  If you have a medical emergency, please immediately call 911 or go to the emergency department. In the event of inclement weather, please call our main line at [phone] for an update on the status of any delays or closures.  Dermatology Medication Tips: Please keep the boxes that topical medications come in in order to help keep track of the instructions about where and how to use these. Pharmacies typically print the medication instructions only on the boxes and not directly  on the medication tubes.   If your medication is too expensive, please contact our office at [phone] or send us  a message through MyChart.   We are unable to tell what your co-pay for medications will be in advance as this is different depending on your insurance coverage. However, we may be able to find a substitute medication at lower cost or fill out  paperwork to get insurance to cover a needed medication.   If a prior authorization is required to get your medication covered by your insurance company, please allow us  1-2 business days to complete this process.  Drug prices often vary depending on where the prescription is filled and some pharmacies may offer cheaper prices.  The website www.goodrx.com contains coupons for medications through different pharmacies. The prices here do not account for what the cost may be with help from insurance (it may be cheaper with your insurance), but the website can give you the price if you did not use any insurance.  - You can print the associated coupon and take it with your prescription to the pharmacy.  - You may also stop by our office during regular business hours and pick up a GoodRx coupon card.  - If you need your prescription sent electronically to a different pharmacy, notify our office through [facility] or by phone at [phone]

## 2024-09-06 NOTE — Progress Notes (Signed)
" ° °  New Patient Visit   Subjective  Ivan Dickson is a 74 y.o. male who presents for a NEW PATIENT appointment to be examined for the concerns as listed below.   Spot Problem: Pt stated that he has irritation at the R cheek that presented 1 year ago and feels rough to the touch. The area is slightly painful - rating it 2/10. It does not itch. He stated the area feels better after shaving. Pt mentioned that he previously was seeing a dermatologist in [facility] but since he has moved to [place] 3 years he has not been checked.    The following portions of the chart were reviewed this encounter and updated as appropriate: medications, allergies, medical history  Review of Systems:  No other skin or systemic complaints except as noted in HPI or Assessment and Plan.  Objective  Well appearing patient in no apparent distress; mood and affect are within normal limits.   A focused examination was performed of the following areas: R cheek   Relevant exam findings are noted in the Assessment and Plan.        Assessment & Plan    Seborrheic dermatitis Chronic seborrheic dermatitis primarily affecting the right cheek with mild erythema and flakiness. Symptoms are non-itchy and exacerbated by hair growth. Likely due to yeast overgrowth in oil glands, common in hair-bearing areas.   - Prescribed ketoconazole  cream to be applied twice daily until resolution, typically 1-3 weeks. - Provided refills for ketoconazole  cream for future flare-ups. - Advised use of light moisturizer, such as CeraVe, to maintain skin hydration, especially in winter months.  Inflammatory papule, right mandibular jawline Pronounced inflammation on the right mandibular jawline, possibly related to seborrheic dermatitis or a separate lesion requiring biopsy. Differential includes inflammation from seborrheic dermatitis versus a distinct lesion.  - Marked the area for re-evaluation in three months. - Instructed to take a picture of  the area for future reference. - Will schedule follow-up in three months to reassess the lesion and perform a biopsy if necessary. - Will conduct a full skin cancer screening at the follow-up appointment.   No follow-ups on file.   Documentation: I have reviewed the above documentation for accuracy and completeness, and I agree with the above.  I, Shirron Maranda, CMA II, am acting as scribe for:   Delon Lenis, DO     "

## 2024-09-23 ENCOUNTER — Ambulatory Visit: Admitting: Internal Medicine

## 2024-09-23 ENCOUNTER — Encounter: Payer: Self-pay | Admitting: Internal Medicine

## 2024-09-23 VITALS — BP 120/80 | HR 88 | Temp 98.1°F | Resp 18 | Ht 71.0 in | Wt 184.5 lb

## 2024-09-23 DIAGNOSIS — I951 Orthostatic hypotension: Secondary | ICD-10-CM

## 2024-09-23 DIAGNOSIS — F515 Nightmare disorder: Secondary | ICD-10-CM | POA: Diagnosis not present

## 2024-09-23 DIAGNOSIS — I10 Essential (primary) hypertension: Secondary | ICD-10-CM

## 2024-09-23 DIAGNOSIS — E1169 Type 2 diabetes mellitus with other specified complication: Secondary | ICD-10-CM | POA: Diagnosis not present

## 2024-09-23 DIAGNOSIS — E785 Hyperlipidemia, unspecified: Secondary | ICD-10-CM | POA: Diagnosis not present

## 2024-09-23 DIAGNOSIS — Z0000 Encounter for general adult medical examination without abnormal findings: Secondary | ICD-10-CM

## 2024-09-23 DIAGNOSIS — I25118 Atherosclerotic heart disease of native coronary artery with other forms of angina pectoris: Secondary | ICD-10-CM

## 2024-09-23 MED ORDER — FLUDROCORTISONE ACETATE 0.1 MG PO TABS: 0.1000 mg | ORAL_TABLET | Freq: Two times a day (BID) | ORAL | 3 refills | Status: AC

## 2024-09-23 MED ORDER — DTAP-IPV VACCINE IM SUSP: 0.5000 mL | Freq: Once | INTRAMUSCULAR | 0 refills | Status: AC

## 2024-09-23 MED ORDER — ZOSTER VAC RECOMB ADJUVANTED 50 MCG/0.5ML IM SUSR: 0.5000 mL | Freq: Once | INTRAMUSCULAR | 0 refills | Status: AC

## 2024-09-23 NOTE — Assessment & Plan Note (Signed)
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  I have suggested to take fludrocortisone  0.1 mg twice a day until that time.  He will also use shower chair to avoid falling again. "

## 2024-09-23 NOTE — Assessment & Plan Note (Signed)
"    I have suggested that he will add salt in his diet and keep his blood pressure on higher end. "

## 2024-09-23 NOTE — Assessment & Plan Note (Signed)
"    His cholesterol and diabetes is well controlled.  I have done diabetic foot exam.  He has diminished feeling in his right heel.  He will continue to follow with eye doctor. "

## 2024-09-23 NOTE — Assessment & Plan Note (Signed)
"    His prazosin  was stopped because of syncopal episodes. "

## 2024-09-23 NOTE — Assessment & Plan Note (Signed)
"    He has coronary artery disease with very high calcium  score.  He will continue to watch his diet. "

## 2024-09-23 NOTE — Progress Notes (Signed)
 "  Office Visit  Subjective   Patient ID: Ivan Dickson   DOB: July 26, 1951   Age: 74 y.o.   MRN: [id number]   Chief Complaint Chief Complaint  Patient presents with   Annual Exam    Medicare AWV     History of Present Illness 74 years old male is here for annual wellness examination.   He says that since last visit he passed out in shower and hurt his back.  He was alone at home.  He was taken to the emergency room by his neighbor.  He is complaining of back pain where x-ray was normal for any bony fracture.  He is wearing back brace and he was [facility] and he has a follow-up appointment with them.  This is a 2nd syncopal episode within last 1 year.  He fell 3 times within last 1 year. He says that he drive without any problem.  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  His cardiologist has stopped his prazosin  1 mg daily that he was getting for bad dreams.  He stopped taking fludrocortisone  because his blood pressure was elevated. He will see neurologist in March for further evaluation.   He scored 30/30 on mini-mental status examination.  He get flu shot every year, He has pneumonia vaccine after the age of 65. He never has shingle vaccine and is not interested in taking it. He has three COVID vaccine.   He is not sure when was his last tetanus booster was given.  He denies any urinary complaint. He has colonoscopy few years ago and no polyp were removed.  He is 74 years old and he will not need any further screening after the age of 55.  His mother has diabetes, heart problem and father died of stroke and sister died of renal cell carcinoma. His brother also has parkinsonism.     He has a knee pain and he was given knee injection by Orthopedic. He will also has medial nerve release surgery of left elbow at the end of this month.  He has no fall.    He has diabetes mellitus and  his last hemoglobin A1c was 7.2  On December 07/2024.  He follows with eye doctor at [facility] care nurse will and he was seen with the last 1 year. He do not have microalbuminuria.   He takes Tresiba  10 units daily, Synjardy , Trulicity  3 mg weekly and Actos  30 mg daily.   He has CAD s/p stent placed in the past.  He does not have any chest pain or exertional dyspnea.  He follows with cardiologist.    He also has dyslipidemia where LDL was not controlled in spite of being on rosuvastatin .  I have started him on Repatha  and his LDL  Was 21 on June 18, 2024.  His cardiologist has decrease the dose of rosuvastatin  to 20 mg daily.  He is also watching his diet and do regular exercise.   He says that he is very restless at nighttime and toss and turn.  His wife did complain to him about that.  He says that when he wakes up he feels fresh and ready to go.  He has sleep study done when he was young and that was normal.   Past Medical History Past Medical History:  Diagnosis Date   Aortic atherosclerosis    CAD (coronary artery disease)  s/p DES to mid RCA 12/2021   Diabetes mellitus without complication (HCC)    Dizziness    GERD (gastroesophageal reflux disease)    High cholesterol    History of colon polyps    HTN (hypertension)    Lightheadedness    Nonadherence with dietary restriction 10/20/2017   NONCOMPLIANCE WITH DIETARY REGIMEN; Original code:Z91.11Entered By: Geofm Polite MD; Signed By: Geofm Polite MD     Nonspecific syndrome suggestive of viral illness 09/14/2016   VIRAL SYNDROME; Original code:B97.89Entered By: Norleen Nicolas M.D.; Signed By: Norleen Nicolas M.D.       Allergies Allergies[1]   Review of Systems Review of Systems  Constitutional: Negative.   HENT: Negative.    Respiratory: Negative.    Cardiovascular: Negative.   Gastrointestinal: Negative.   Musculoskeletal:  Positive for back pain.  Neurological: Negative.        Objective:    Vitals BP 120/80    Pulse 88   Temp 98.1 F (36.7 C)   Resp 18   Ht 5' 11 (1.803 m)   Wt 184 lb 8 oz (83.7 kg)   SpO2 98%   BMI 25.73 kg/m    Physical Examination Physical Exam Constitutional:      Appearance: Normal appearance.  HENT:     Head: Normocephalic and atraumatic.  Cardiovascular:     Rate and Rhythm: Normal rate and regular rhythm.     Heart sounds: Normal heart sounds.  Pulmonary:     Effort: Pulmonary effort is normal.     Breath sounds: Normal breath sounds.  Abdominal:     General: Bowel sounds are normal.     Palpations: Abdomen is soft.  Neurological:     General: No focal deficit present.     Mental Status: He is alert and oriented to person, place, and time.        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  I have suggested to take fludrocortisone  0.1 mg twice a day until that time.  He will also use shower chair to avoid falling again.  HTN (hypertension)   I have suggested that he will add salt in his diet and keep his blood pressure on higher end.  CAD (coronary artery disease)   He has coronary artery disease with very high calcium  score.  He will continue to watch his diet.  Dyslipidemia associated with type 2 diabetes mellitus (HCC)   His cholesterol and diabetes is well controlled.  I have done diabetic foot exam.  He has diminished feeling in his right heel.  He will continue to follow with eye doctor.  Bad dreams   His prazosin  was stopped because of syncopal episodes.    Return in about 3 months (around 12/22/2024).   Roetta Dare, MD      [1]  Allergies Allergen Reactions   Midodrine  Other (See Comments)    Bloating and gas   Ciprofloxacin Itching and Rash   "

## 2024-09-23 NOTE — Assessment & Plan Note (Signed)
"    I will send prescription of tetanus booster and shingles vaccine to his pharmacy. "

## 2024-09-27 ENCOUNTER — Other Ambulatory Visit: Payer: Self-pay | Admitting: Internal Medicine

## 2024-09-28 ENCOUNTER — Telehealth: Payer: Self-pay | Admitting: Diagnostic Neuroimaging

## 2024-09-28 NOTE — Telephone Encounter (Signed)
 Patient called due to received a message there was earlier appointment on 09/30/24.  I checked and there was not an appointment available.  Informed patient and he could call to check for a earlier appointment

## 2024-10-11 ENCOUNTER — Ambulatory Visit: Admitting: Cardiology

## 2024-11-02 ENCOUNTER — Ambulatory Visit: Admitting: Cardiology

## 2024-11-18 ENCOUNTER — Ambulatory Visit: Admitting: Dermatology

## 2024-11-22 ENCOUNTER — Ambulatory Visit: Admitting: Diagnostic Neuroimaging

## 2024-12-20 ENCOUNTER — Ambulatory Visit: Admitting: Internal Medicine
